# Patient Record
Sex: Female | Born: 1960 | Race: White | Hispanic: No | Marital: Married | Smoking: Never smoker
Health system: Southern US, Community
[De-identification: ages and names within clinical notes are randomized; demographics above are authoritative.]

## PROBLEM LIST (undated history)

## (undated) DIAGNOSIS — N951 Menopausal and female climacteric states: Secondary | ICD-10-CM

## (undated) DIAGNOSIS — K589 Irritable bowel syndrome without diarrhea: Secondary | ICD-10-CM

## (undated) DIAGNOSIS — I719 Aortic aneurysm of unspecified site, without rupture: Secondary | ICD-10-CM

## (undated) DIAGNOSIS — H9313 Tinnitus, bilateral: Secondary | ICD-10-CM

## (undated) DIAGNOSIS — Z5189 Encounter for other specified aftercare: Secondary | ICD-10-CM

## (undated) DIAGNOSIS — D126 Benign neoplasm of colon, unspecified: Secondary | ICD-10-CM

## (undated) HISTORY — PX: BREAST SURGERY: SHX581

## (undated) HISTORY — DX: Benign neoplasm of colon, unspecified: D12.6

## (undated) HISTORY — DX: Tinnitus, bilateral: H93.13

## (undated) HISTORY — DX: Menopausal and female climacteric states: N95.1

## (undated) HISTORY — DX: Irritable bowel syndrome, unspecified: K58.9

## (undated) HISTORY — DX: Aortic aneurysm of unspecified site, without rupture: I71.9

## (undated) HISTORY — PX: KNEE ARTHROPLASTY: SHX992

## (undated) HISTORY — PX: BACK SURGERY: SHX140

## (undated) HISTORY — PX: OTHER SURGICAL HISTORY: SHX169

## (undated) HISTORY — PX: NOSE SURGERY: SHX723

## (undated) HISTORY — DX: Encounter for other specified aftercare: Z51.89

---

## 1998-03-01 ENCOUNTER — Other Ambulatory Visit: Admission: RE | Admit: 1998-03-01 | Discharge: 1998-03-01 | Payer: Self-pay | Admitting: Gynecology

## 1998-04-12 ENCOUNTER — Other Ambulatory Visit: Admission: RE | Admit: 1998-04-12 | Discharge: 1998-04-12 | Payer: Self-pay | Admitting: Gynecology

## 1999-03-14 ENCOUNTER — Other Ambulatory Visit: Admission: RE | Admit: 1999-03-14 | Discharge: 1999-03-14 | Payer: Self-pay | Admitting: Gynecology

## 2000-09-05 HISTORY — PX: AUGMENTATION MAMMAPLASTY: SUR837

## 2000-09-17 ENCOUNTER — Encounter: Payer: Self-pay | Admitting: Internal Medicine

## 2000-09-17 ENCOUNTER — Encounter: Admission: RE | Admit: 2000-09-17 | Discharge: 2000-09-17 | Payer: Self-pay | Admitting: Internal Medicine

## 2001-03-18 ENCOUNTER — Other Ambulatory Visit: Admission: RE | Admit: 2001-03-18 | Discharge: 2001-03-18 | Payer: Self-pay | Admitting: Internal Medicine

## 2001-09-23 ENCOUNTER — Encounter: Admission: RE | Admit: 2001-09-23 | Discharge: 2001-09-23 | Payer: Self-pay | Admitting: Sports Medicine

## 2001-10-07 ENCOUNTER — Encounter: Admission: RE | Admit: 2001-10-07 | Discharge: 2001-10-07 | Payer: Self-pay | Admitting: Sports Medicine

## 2002-05-19 ENCOUNTER — Encounter: Admission: RE | Admit: 2002-05-19 | Discharge: 2002-05-19 | Payer: Self-pay | Admitting: Internal Medicine

## 2002-05-19 ENCOUNTER — Encounter: Payer: Self-pay | Admitting: Internal Medicine

## 2002-05-27 ENCOUNTER — Emergency Department (HOSPITAL_COMMUNITY): Admission: EM | Admit: 2002-05-27 | Discharge: 2002-05-27 | Payer: Self-pay | Admitting: *Deleted

## 2002-06-16 ENCOUNTER — Encounter: Admission: RE | Admit: 2002-06-16 | Discharge: 2002-06-16 | Payer: Self-pay | Admitting: Sports Medicine

## 2002-06-30 ENCOUNTER — Other Ambulatory Visit: Admission: RE | Admit: 2002-06-30 | Discharge: 2002-06-30 | Payer: Self-pay | Admitting: Internal Medicine

## 2003-03-01 ENCOUNTER — Emergency Department (HOSPITAL_COMMUNITY): Admission: EM | Admit: 2003-03-01 | Discharge: 2003-03-01 | Payer: Self-pay | Admitting: Emergency Medicine

## 2003-06-08 ENCOUNTER — Other Ambulatory Visit: Admission: RE | Admit: 2003-06-08 | Discharge: 2003-06-08 | Payer: Self-pay | Admitting: Gynecology

## 2004-01-11 ENCOUNTER — Encounter: Admission: RE | Admit: 2004-01-11 | Discharge: 2004-01-11 | Payer: Self-pay | Admitting: Sports Medicine

## 2004-02-01 ENCOUNTER — Encounter: Admission: RE | Admit: 2004-02-01 | Discharge: 2004-02-01 | Payer: Self-pay | Admitting: Sports Medicine

## 2004-03-14 ENCOUNTER — Encounter: Admission: RE | Admit: 2004-03-14 | Discharge: 2004-03-14 | Payer: Self-pay | Admitting: Gynecology

## 2004-03-28 ENCOUNTER — Encounter: Admission: RE | Admit: 2004-03-28 | Discharge: 2004-03-28 | Payer: Self-pay | Admitting: Gynecology

## 2004-06-20 ENCOUNTER — Other Ambulatory Visit: Admission: RE | Admit: 2004-06-20 | Discharge: 2004-06-20 | Payer: Self-pay | Admitting: Gynecology

## 2005-08-14 ENCOUNTER — Other Ambulatory Visit: Admission: RE | Admit: 2005-08-14 | Discharge: 2005-08-14 | Payer: Self-pay | Admitting: Gynecology

## 2005-10-02 ENCOUNTER — Encounter: Admission: RE | Admit: 2005-10-02 | Discharge: 2005-10-02 | Payer: Self-pay | Admitting: Gynecology

## 2005-11-27 ENCOUNTER — Ambulatory Visit (HOSPITAL_COMMUNITY): Admission: RE | Admit: 2005-11-27 | Discharge: 2005-11-27 | Payer: Self-pay | Admitting: Gynecology

## 2006-08-06 ENCOUNTER — Ambulatory Visit: Admission: RE | Admit: 2006-08-06 | Discharge: 2006-08-06 | Payer: Self-pay | Admitting: Gynecologic Oncology

## 2006-11-19 ENCOUNTER — Encounter: Admission: RE | Admit: 2006-11-19 | Discharge: 2006-11-19 | Payer: Self-pay | Admitting: Gynecology

## 2006-11-26 ENCOUNTER — Other Ambulatory Visit: Admission: RE | Admit: 2006-11-26 | Discharge: 2006-11-26 | Payer: Self-pay | Admitting: Gynecology

## 2007-01-09 ENCOUNTER — Encounter: Admission: RE | Admit: 2007-01-09 | Discharge: 2007-01-09 | Payer: Self-pay | Admitting: Orthopedic Surgery

## 2007-12-09 ENCOUNTER — Other Ambulatory Visit: Admission: RE | Admit: 2007-12-09 | Discharge: 2007-12-09 | Payer: Self-pay | Admitting: Gynecology

## 2007-12-09 ENCOUNTER — Encounter: Admission: RE | Admit: 2007-12-09 | Discharge: 2007-12-09 | Payer: Self-pay | Admitting: Gynecology

## 2009-03-22 ENCOUNTER — Encounter: Admission: RE | Admit: 2009-03-22 | Discharge: 2009-03-22 | Payer: Self-pay | Admitting: Obstetrics and Gynecology

## 2010-03-28 ENCOUNTER — Encounter: Admission: RE | Admit: 2010-03-28 | Discharge: 2010-03-28 | Payer: Self-pay | Admitting: Obstetrics and Gynecology

## 2010-11-26 ENCOUNTER — Encounter: Payer: Self-pay | Admitting: Gynecology

## 2010-12-19 ENCOUNTER — Encounter: Payer: Self-pay | Admitting: Sports Medicine

## 2010-12-19 ENCOUNTER — Encounter (INDEPENDENT_AMBULATORY_CARE_PROVIDER_SITE_OTHER): Payer: BC Managed Care – PPO | Admitting: Sports Medicine

## 2010-12-19 DIAGNOSIS — M25519 Pain in unspecified shoulder: Secondary | ICD-10-CM

## 2010-12-19 DIAGNOSIS — R269 Unspecified abnormalities of gait and mobility: Secondary | ICD-10-CM

## 2010-12-19 DIAGNOSIS — M2170 Unequal limb length (acquired), unspecified site: Secondary | ICD-10-CM | POA: Insufficient documentation
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
## 2011-02-20 ENCOUNTER — Other Ambulatory Visit: Payer: Self-pay | Admitting: Obstetrics and Gynecology

## 2011-02-20 DIAGNOSIS — Z1231 Encounter for screening mammogram for malignant neoplasm of breast: Secondary | ICD-10-CM

## 2011-03-06 ENCOUNTER — Ambulatory Visit (INDEPENDENT_AMBULATORY_CARE_PROVIDER_SITE_OTHER): Payer: BC Managed Care – PPO | Admitting: Sports Medicine

## 2011-03-06 ENCOUNTER — Encounter: Payer: Self-pay | Admitting: Sports Medicine

## 2011-03-06 VITALS — BP 114/72 | HR 46

## 2011-03-06 DIAGNOSIS — M2170 Unequal limb length (acquired), unspecified site: Secondary | ICD-10-CM

## 2011-03-06 DIAGNOSIS — R269 Unspecified abnormalities of gait and mobility: Secondary | ICD-10-CM

## 2011-03-06 NOTE — Assessment & Plan Note (Signed)
Patient was fitted for a : standard, cushioned, semi-rigid orthotic. The orthotic was heated and afterward the patient stood on the orthotic blank positioned on the orthotic stand. The patient was positioned in subtalar neutral position and 10 degrees of ankle dorsiflexion in a weight bearing stance. After completion of molding, a stable base was applied to the orthotic blank. The blank was ground to a stable position for weight bearing. Size: 9 black stripe Base: Green F3 Posting: Blue foam on rt Additional orthotic padding: None  Pt's walking and running gait was in a neutral position with orthotics.  She states they are comfortable, but feels slightly more cushion in the front of her arch.  She does not think this will cause a problem, wants to try first before adjustment.  Pt was advised to return to clinic for adjustment if needed.

## 2011-03-06 NOTE — Assessment & Plan Note (Addendum)
New orthotics made today to correct.  Gait neutral with orthotics.

## 2011-03-06 NOTE — Progress Notes (Signed)
  Subjective:    Patient ID: Kathy Weaver, female    DOB: 1961-10-24, 50 y.o.   MRN: [id number]  HPI  Pt here today for orthotics redo.  States the orthotics that were made for her in Feb have been causing blisters in her arches.  The previous orthotics had been adjusted on 3 different occassions, thus Dr. Darrick Penna said new orthotics should be made for patient at no charge.  Pt is running 2-3 times per week.  Review of Systems     Objective:   Physical Exam See previous visit       Assessment & Plan:
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
## 2011-04-10 ENCOUNTER — Ambulatory Visit
Admission: RE | Admit: 2011-04-10 | Discharge: 2011-04-10 | Disposition: A | Discharge status: Home / Self Care | Payer: BC Managed Care – PPO | Source: Ambulatory Visit | Attending: Obstetrics and Gynecology | Admitting: Obstetrics and Gynecology

## 2011-04-10 DIAGNOSIS — Z1231 Encounter for screening mammogram for malignant neoplasm of breast: Secondary | ICD-10-CM

## 2012-06-02 ENCOUNTER — Other Ambulatory Visit: Payer: Self-pay | Admitting: Obstetrics and Gynecology

## 2012-06-02 DIAGNOSIS — Z1231 Encounter for screening mammogram for malignant neoplasm of breast: Secondary | ICD-10-CM

## 2012-07-08 ENCOUNTER — Ambulatory Visit
Admission: RE | Admit: 2012-07-08 | Discharge: 2012-07-08 | Disposition: A | Discharge status: Home / Self Care | Payer: BC Managed Care – PPO | Source: Ambulatory Visit | Attending: Obstetrics and Gynecology | Admitting: Obstetrics and Gynecology

## 2012-07-08 DIAGNOSIS — Z1231 Encounter for screening mammogram for malignant neoplasm of breast: Secondary | ICD-10-CM

## 2012-12-10 ENCOUNTER — Other Ambulatory Visit: Payer: Self-pay | Admitting: Orthopaedic Surgery

## 2012-12-23 ENCOUNTER — Encounter (HOSPITAL_BASED_OUTPATIENT_CLINIC_OR_DEPARTMENT_OTHER): Payer: Self-pay | Admitting: *Deleted
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
## 2012-12-25 ENCOUNTER — Ambulatory Visit (INDEPENDENT_AMBULATORY_CARE_PROVIDER_SITE_OTHER): Payer: BC Managed Care – PPO | Admitting: General Surgery

## 2012-12-25 ENCOUNTER — Encounter (INDEPENDENT_AMBULATORY_CARE_PROVIDER_SITE_OTHER): Payer: Self-pay | Admitting: General Surgery

## 2012-12-25 VITALS — BP 100/62 | HR 64 | Resp 18 | Ht 68.5 in | Wt 141.0 lb

## 2012-12-25 DIAGNOSIS — D171 Benign lipomatous neoplasm of skin and subcutaneous tissue of trunk: Secondary | ICD-10-CM

## 2012-12-25 DIAGNOSIS — D1739 Benign lipomatous neoplasm of skin and subcutaneous tissue of other sites: Secondary | ICD-10-CM

## 2012-12-25 NOTE — Progress Notes (Signed)
Patient ID: Kathy Weaver, female   DOB: 1961/10/24, 52 y.o.   MRN: [id number]  Chief Complaint  Patient presents with  . Other    Eval mass on abdomen    HPI Kathy Weaver is a 52 y.o. female.   HPI  She noticed a subcutaneous soft tissue mass in the right lower chest wall/right upper quadrant area about 2 years ago. The mass is mobile. It has not changed in size. It is not painful. It has not been infected or blood. She is unaware of any trauma to the area. She was seen by her primary care physician Dr. Valentina Lucks and he recommended she have a surgeon evaluate it.  She is going to have shoulder surgery in 5 days. She is a International aid/development worker.  Past Medical History  Diagnosis Date  . Blood transfusion without reported diagnosis     Past Surgical History  Procedure Laterality Date  . Left femur fracture      1990  . Breast surgery      right breast implant  . Back surgery      spinal fusion  for L4( rods)    History reviewed. No pertinent family history.  Social History History  Substance Use Topics  . Smoking status: Never Smoker   . Smokeless tobacco: Not on file  . Alcohol Use: Yes     Comment: wine 4 days a week    Allergies  Allergen Reactions  . Penicillins Hives  . Sulfur Swelling    Current Outpatient Prescriptions  Medication Sig Dispense Refill  . cholecalciferol (VITAMIN D) 1000 UNITS tablet Take 1,000 Units by mouth daily.      Marland Kitchen etonogestrel-ethinyl estradiol (NUVARING) 0.12-0.015 MG/24HR vaginal ring Place 1 each vaginally every 28 (twenty-eight) days. Insert vaginally and leave in place for 3 consecutive weeks, then remove for 1 week.      Marland Kitchen ibuprofen (ADVIL,MOTRIN) 200 MG tablet Take 200 mg by mouth every 6 (six) hours as needed for pain.       No current facility-administered medications for this visit.    Review of Systems Review of Systems  Constitutional: Negative.   Respiratory: Negative.   Cardiovascular: Negative.   Musculoskeletal: Positive for  arthralgias (Right shoulder).    Blood pressure 100/62, pulse 64, resp. rate 18, height 5' 8.5" (1.74 m), weight 141 lb (63.957 kg), last menstrual period 12/15/2012.  Physical Exam Physical Exam  Constitutional: She appears well-developed and well-nourished. No distress.  Abdominal:  In the right upper quadrant region/right chest wall area is a 2.5 cm mobile nontender nonerythematous soft tissue mass. No skin changes over it.    Data Reviewed none  Assessment    Right lower chest wall/right upper quadrant soft tissue mass in the subcutaneous tissues. Clinically, this is consistent with a lipoma. It is not symptomatic. It has not changed size in 2 years by her report.     Plan    No current indication for removal. If he becomes symptomatic or larger however I have asked her to inform us as I think at that time removal of it would be indicated.        Talayeh Bruinsma J 12/25/2012, 9:13 AM

## 2012-12-25 NOTE — Patient Instructions (Signed)
Call if the area gets larger or becomes painful.

## 2012-12-30 ENCOUNTER — Ambulatory Visit (HOSPITAL_BASED_OUTPATIENT_CLINIC_OR_DEPARTMENT_OTHER): Payer: BC Managed Care – PPO | Admitting: Anesthesiology

## 2012-12-30 ENCOUNTER — Encounter (HOSPITAL_BASED_OUTPATIENT_CLINIC_OR_DEPARTMENT_OTHER)
Admission: RE | Disposition: A | Discharge status: Home / Self Care | Payer: Self-pay | Source: Ambulatory Visit | Attending: Orthopaedic Surgery

## 2012-12-30 ENCOUNTER — Encounter (HOSPITAL_BASED_OUTPATIENT_CLINIC_OR_DEPARTMENT_OTHER): Payer: Self-pay | Admitting: Anesthesiology

## 2012-12-30 ENCOUNTER — Encounter (HOSPITAL_BASED_OUTPATIENT_CLINIC_OR_DEPARTMENT_OTHER): Payer: Self-pay

## 2012-12-30 ENCOUNTER — Ambulatory Visit (HOSPITAL_BASED_OUTPATIENT_CLINIC_OR_DEPARTMENT_OTHER)
Admission: RE | Admit: 2012-12-30 | Discharge: 2012-12-30 | Disposition: A | Discharge status: Home / Self Care | Payer: BC Managed Care – PPO | Source: Ambulatory Visit | Attending: Orthopaedic Surgery | Admitting: Orthopaedic Surgery

## 2012-12-30 DIAGNOSIS — M7530 Calcific tendinitis of unspecified shoulder: Secondary | ICD-10-CM | POA: Insufficient documentation

## 2012-12-30 DIAGNOSIS — M25819 Other specified joint disorders, unspecified shoulder: Secondary | ICD-10-CM | POA: Insufficient documentation

## 2012-12-30 DIAGNOSIS — M7541 Impingement syndrome of right shoulder: Secondary | ICD-10-CM | POA: Diagnosis present

## 2012-12-30 HISTORY — PX: SHOULDER ARTHROSCOPY: SHX128

## 2012-12-30 SURGERY — ARTHROSCOPY, SHOULDER
Anesthesia: General | Site: Shoulder | Laterality: Right | Wound class: Clean

## 2012-12-30 MED ORDER — LIDOCAINE HCL (CARDIAC) 20 MG/ML IV SOLN
INTRAVENOUS | Status: DC | PRN
  Administered 2012-12-30: 80 mg via INTRAVENOUS

## 2012-12-30 MED ORDER — PROPOFOL 10 MG/ML IV BOLUS
INTRAVENOUS | Status: DC | PRN
  Administered 2012-12-30: 170 mg via INTRAVENOUS

## 2012-12-30 MED ORDER — MIDAZOLAM HCL 2 MG/2ML IJ SOLN
1.0000 mg | INTRAMUSCULAR | Status: DC | PRN
  Administered 2012-12-30: 2 mg via INTRAVENOUS

## 2012-12-30 MED ORDER — HYDROCODONE-ACETAMINOPHEN 5-325 MG PO TABS: 1.0000 | ORAL_TABLET | Freq: Four times a day (QID) | ORAL | Status: DC | PRN

## 2012-12-30 MED ORDER — ONDANSETRON HCL 4 MG/2ML IJ SOLN
INTRAMUSCULAR | Status: DC | PRN
  Administered 2012-12-30: 4 mg via INTRAVENOUS

## 2012-12-30 MED ORDER — OXYCODONE HCL 5 MG PO TABS: 5.0000 mg | ORAL_TABLET | Freq: Once | ORAL | Status: DC | PRN

## 2012-12-30 MED ORDER — LACTATED RINGERS IV SOLN
INTRAVENOUS | Status: DC
  Administered 2012-12-30 (×2): via INTRAVENOUS

## 2012-12-30 MED ORDER — SODIUM CHLORIDE 0.9 % IR SOLN
Status: DC | PRN
  Administered 2012-12-30: 5000 mL

## 2012-12-30 MED ORDER — LACTATED RINGERS IV SOLN
INTRAVENOUS | Status: DC
  Administered 2012-12-30: 10:00:00 via INTRAVENOUS

## 2012-12-30 MED ORDER — HYDROMORPHONE HCL PF 1 MG/ML IJ SOLN: 0.2500 mg | INTRAMUSCULAR | Status: DC | PRN

## 2012-12-30 MED ORDER — EPHEDRINE SULFATE 50 MG/ML IJ SOLN
INTRAMUSCULAR | Status: DC | PRN
  Administered 2012-12-30: 10 mg via INTRAVENOUS

## 2012-12-30 MED ORDER — BUPIVACAINE-EPINEPHRINE PF 0.5-1:200000 % IJ SOLN
INTRAMUSCULAR | Status: DC | PRN
  Administered 2012-12-30: 25 mL

## 2012-12-30 MED ORDER — SUCCINYLCHOLINE CHLORIDE 20 MG/ML IJ SOLN
INTRAMUSCULAR | Status: DC | PRN
  Administered 2012-12-30: 100 mg via INTRAVENOUS

## 2012-12-30 MED ORDER — FENTANYL CITRATE 0.05 MG/ML IJ SOLN
50.0000 ug | INTRAMUSCULAR | Status: DC | PRN
  Administered 2012-12-30: 100 ug via INTRAVENOUS

## 2012-12-30 MED ORDER — CEFAZOLIN SODIUM-DEXTROSE 2-3 GM-% IV SOLR
2.0000 g | INTRAVENOUS | Status: AC
  Administered 2012-12-30: 2 g via INTRAVENOUS

## 2012-12-30 MED ORDER — DEXAMETHASONE SODIUM PHOSPHATE 4 MG/ML IJ SOLN
INTRAMUSCULAR | Status: DC | PRN
  Administered 2012-12-30: 4 mg

## 2012-12-30 MED ORDER — OXYCODONE HCL 5 MG/5ML PO SOLN: 5.0000 mg | Freq: Once | ORAL | Status: DC | PRN

## 2012-12-30 MED ORDER — PROMETHAZINE HCL 25 MG/ML IJ SOLN: 6.2500 mg | INTRAMUSCULAR | Status: DC | PRN

## 2012-12-30 MED ORDER — CHLORHEXIDINE GLUCONATE 4 % EX LIQD: 60.0000 mL | Freq: Once | CUTANEOUS | Status: DC

## 2012-12-30 SURGICAL SUPPLY — 73 items
ADH SKN CLS APL DERMABOND .7 (GAUZE/BANDAGES/DRESSINGS)
APL SKNCLS STERI-STRIP NONHPOA (GAUZE/BANDAGES/DRESSINGS)
BENZOIN TINCTURE PRP APPL 2/3 (GAUZE/BANDAGES/DRESSINGS) IMPLANT
BLADE CUDA 5.5 (BLADE) IMPLANT
BLADE GREAT WHITE 4.2 (BLADE) ×2 IMPLANT
BLADE SURG 15 STRL LF DISP TIS (BLADE) IMPLANT
BLADE SURG 15 STRL SS (BLADE)
BUR VERTEX HOODED 4.5 (BURR) IMPLANT
CANISTER OMNI JUG 16 LITER (MISCELLANEOUS) ×2 IMPLANT
CANISTER SUCTION 2500CC (MISCELLANEOUS) IMPLANT
CANNULA SHOULDER 7CM (CANNULA) ×2 IMPLANT
CANNULA TWIST IN 8.25X7CM (CANNULA) IMPLANT
DECANTER SPIKE VIAL GLASS SM (MISCELLANEOUS) IMPLANT
DERMABOND ADVANCED (GAUZE/BANDAGES/DRESSINGS)
DERMABOND ADVANCED .7 DNX12 (GAUZE/BANDAGES/DRESSINGS) IMPLANT
DRAPE OEC MINIVIEW 54X84 (DRAPES) ×1 IMPLANT
DRAPE STERI 35X30 U-POUCH (DRAPES) ×2 IMPLANT
DRAPE U-SHAPE 47X51 STRL (DRAPES) ×2 IMPLANT
DRAPE U-SHAPE 76X120 STRL (DRAPES) ×4 IMPLANT
DRSG EMULSION OIL 3X3 NADH (GAUZE/BANDAGES/DRESSINGS) ×2 IMPLANT
DRSG PAD ABDOMINAL 8X10 ST (GAUZE/BANDAGES/DRESSINGS) ×2 IMPLANT
DURAPREP 26ML APPLICATOR (WOUND CARE) ×2 IMPLANT
ELECT MENISCUS 165MM 90D (ELECTRODE) IMPLANT
ELECT REM PT RETURN 9FT ADLT (ELECTROSURGICAL) ×2
ELECTRODE REM PT RTRN 9FT ADLT (ELECTROSURGICAL) ×1 IMPLANT
GLOVE BIO SURGEON STRL SZ 6.5 (GLOVE) ×1 IMPLANT
GLOVE BIO SURGEON STRL SZ8.5 (GLOVE) ×2 IMPLANT
GLOVE BIOGEL PI IND STRL 7.0 (GLOVE) IMPLANT
GLOVE BIOGEL PI IND STRL 8 (GLOVE) ×1 IMPLANT
GLOVE BIOGEL PI IND STRL 8.5 (GLOVE) ×1 IMPLANT
GLOVE BIOGEL PI INDICATOR 7.0 (GLOVE) ×1
GLOVE BIOGEL PI INDICATOR 8 (GLOVE) ×1
GLOVE BIOGEL PI INDICATOR 8.5 (GLOVE) ×1
GLOVE SS BIOGEL STRL SZ 8 (GLOVE) ×1 IMPLANT
GLOVE SUPERSENSE BIOGEL SZ 8 (GLOVE) ×1
GOWN PREVENTION PLUS XLARGE (GOWN DISPOSABLE) ×2 IMPLANT
GOWN PREVENTION PLUS XXLARGE (GOWN DISPOSABLE) ×3 IMPLANT
NDL SCORPION MULTI FIRE (NEEDLE) IMPLANT
NDL SUT 6 .5 CRC .975X.05 MAYO (NEEDLE) IMPLANT
NEEDLE MAYO TAPER (NEEDLE)
NEEDLE SCORPION MULTI FIRE (NEEDLE) IMPLANT
NS IRRIG 1000ML POUR BTL (IV SOLUTION) IMPLANT
PACK ARTHROSCOPY DSU (CUSTOM PROCEDURE TRAY) ×2 IMPLANT
PACK BASIN DAY SURGERY FS (CUSTOM PROCEDURE TRAY) ×2 IMPLANT
PASSER SUT SWANSON 36MM LOOP (INSTRUMENTS) IMPLANT
PENCIL BUTTON HOLSTER BLD 10FT (ELECTRODE) IMPLANT
SET ARTHROSCOPY TUBING (MISCELLANEOUS) ×2
SET ARTHROSCOPY TUBING LN (MISCELLANEOUS) ×1 IMPLANT
SHEET MEDIUM DRAPE 40X70 STRL (DRAPES) ×2 IMPLANT
SLEEVE SCD COMPRESS KNEE MED (MISCELLANEOUS) ×1 IMPLANT
SLING ARM FOAM STRAP LRG (SOFTGOODS) ×1 IMPLANT
SLING ARM FOAM STRAP MED (SOFTGOODS) IMPLANT
SLING ARM FOAM STRAP SML (SOFTGOODS) IMPLANT
SLING ARM FOAM STRAP XLG (SOFTGOODS) IMPLANT
SPONGE GAUZE 4X4 12PLY (GAUZE/BANDAGES/DRESSINGS) ×2 IMPLANT
SPONGE LAP 4X18 X RAY DECT (DISPOSABLE) IMPLANT
STRIP CLOSURE SKIN 1/2X4 (GAUZE/BANDAGES/DRESSINGS) IMPLANT
SUCTION FRAZIER TIP 10 FR DISP (SUCTIONS) IMPLANT
SUT ETHIBOND 2 OS 4 DA (SUTURE) IMPLANT
SUT ETHILON 3 0 PS 1 (SUTURE) ×2 IMPLANT
SUT FIBERWIRE #2 38 T-5 BLUE (SUTURE)
SUT PDS AB 2-0 CT2 27 (SUTURE) IMPLANT
SUT VIC AB 0 SH 27 (SUTURE) IMPLANT
SUT VIC AB 2-0 SH 27 (SUTURE)
SUT VIC AB 2-0 SH 27XBRD (SUTURE) IMPLANT
SUT VICRYL 4-0 PS2 18IN ABS (SUTURE) IMPLANT
SUTURE FIBERWR #2 38 T-5 BLUE (SUTURE) IMPLANT
SYR BULB 3OZ (MISCELLANEOUS) IMPLANT
TOWEL OR 17X24 6PK STRL BLUE (TOWEL DISPOSABLE) ×2 IMPLANT
TOWEL OR NON WOVEN STRL DISP B (DISPOSABLE) ×2 IMPLANT
WAND STAR VAC 90 (SURGICAL WAND) ×2 IMPLANT
WATER STERILE IRR 1000ML POUR (IV SOLUTION) ×2 IMPLANT
YANKAUER SUCT BULB TIP NO VENT (SUCTIONS) IMPLANT

## 2012-12-30 NOTE — Interval H&P Note (Signed)
History and Physical Interval Note:  12/30/2012 9:51 AM  Kathy Weaver  has presented today for surgery, with the diagnosis of Right Shoulder Impingement  The various methods of treatment have been discussed with the patient and family. After consideration of risks, benefits and other options for treatment, the patient has consented to  Procedure(s): ARTHROSCOPY SHOULDER (Right) as a surgical intervention .  The patient's history has been reviewed, patient examined, no change in status, stable for surgery.  I have reviewed the patient's chart and labs.  Questions were answered to the patient's satisfaction.     Anushree Dorsi G

## 2012-12-30 NOTE — Transfer of Care (Signed)
Immediate Anesthesia Transfer of Care Note  Patient: Kathy Weaver  Procedure(s) Performed: Procedure(s): Arthroscopy Right Shoulder with Subacromial Decompression and Debridement (Right)  Patient Location: PACU  Anesthesia Type:General and Regional  Level of Consciousness: awake, alert  and oriented  Airway & Oxygen Therapy: Patient Spontanous Breathing and Patient connected to face mask oxygen  Post-op Assessment: Report given to PACU RN and Post -op Vital signs reviewed and stable  Post vital signs: Reviewed and stable  Complications: No apparent anesthesia complications

## 2012-12-30 NOTE — Brief Op Note (Signed)
12/30/2012  11:32 AM  PATIENT:  Kathy Weaver  52 y.o. female  PRE-OPERATIVE DIAGNOSIS:  Right Shoulder Impingement  POST-OPERATIVE DIAGNOSIS:  Right Shoulder Impingement  PROCEDURE:  Procedure(s): Arthroscopy Right Shoulder with Subacromial Decompression and Debridement (Right)  SURGEON:  Surgeon(s) and Role:    * Velna Ochs, MD - Primary  PHYSICIAN ASSISTANT:   ASSISTANTS: none   ANESTHESIA:     EBL:  Total I/O In: 1500 [I.V.:1500] Out: -   BLOOD ADMINISTERED:  DRAINS:   LOCAL MEDICATIONS USED:  SPECIMEN:    DISPOSITION OF SPECIMEN:    COUNTS:    TOURNIQUET:    DICTATION: .  PLAN OF CARE:   PATIENT DISPOSITION:   Delay start of Pharmacological VTE agent (>24hrs) due to surgical blood loss or risk of bleeding: not applicable

## 2012-12-30 NOTE — Progress Notes (Signed)
Assisted Dr. Kasik with right, ultrasound guided, interscalene  block. Side rails up, monitors on throughout procedure. See vital signs in flow sheet. Tolerated Procedure well. 

## 2012-12-30 NOTE — Anesthesia Procedure Notes (Addendum)
Anesthesia Regional Block:  Interscalene brachial plexus block  Pre-Anesthetic Checklist: ,, timeout performed, Correct Patient, Correct Site, Correct Laterality, Correct Procedure, Correct Position, site marked, Risks and benefits discussed,  Surgical consent,  Pre-op evaluation,  At surgeon's request and post-op pain management  Laterality: Right  Prep: chloraprep       Needles:  Injection technique: Single-shot  Needle Type: Echogenic Stimulator Needle     Needle Length: 5cm 5 cm Needle Gauge: 22 and 22 G    Additional Needles:  Procedures: ultrasound guided (picture in chart) and nerve stimulator Interscalene brachial plexus block  Nerve Stimulator or Paresthesia:  Response: 0.5 mA,   Additional Responses:   Narrative:  Start time: 12/30/2012 9:48 AM End time: 12/30/2012 10:00 AM Injection made incrementally with aspirations every 3 mL. Anesthesiologist: Dr Gypsy Balsam  Additional Notes: [id number] R ISB POP CHG prep, sterile tech #22 stim/echo needle with stim down to .5ma and good [place] visualization -pix in chart Multiple neg asp/ no paresthesias Vernia Buff .5% w/epi 1:200000 total 25cc+decadron 4mg  infiltrated No compl Dr Gypsy Balsam   Procedure Name: Intubation Date/Time: 12/30/2012 10:17 AM Performed by: Meyer Russel Pre-anesthesia Checklist: Patient identified, Emergency Drugs available, Suction available and Patient being monitored Patient Re-evaluated:Patient Re-evaluated prior to inductionOxygen Delivery Method: Circle System Utilized Preoxygenation: Pre-oxygenation with 100% oxygen Intubation Type: IV induction Ventilation: Mask ventilation without difficulty Laryngoscope Size: Miller and 2 Grade View: Grade I Tube type: Oral Tube size: 7.0 mm Number of attempts: 1 Airway Equipment and Method: stylet and LTA kit utilized Placement Confirmation: ETT inserted through vocal cords under direct vision,  positive ETCO2 and breath sounds checked- equal and  bilateral Secured at: 22 cm Tube secured with: Tape Dental Injury: Teeth and Oropharynx as per pre-operative assessment

## 2012-12-30 NOTE — Anesthesia Postprocedure Evaluation (Signed)
  Anesthesia Post-op Note  Patient: Kathy Weaver  Procedure(s) Performed: Procedure(s): Arthroscopy Right Shoulder with Subacromial Decompression and Debridement (Right)  Patient Location: PACU  Anesthesia Type:GA combined with regional for post-op pain  Level of Consciousness: awake and alert   Airway and Oxygen Therapy: Patient Spontanous Breathing  Post-op Pain: none  Post-op Assessment: Post-op Vital signs reviewed, Patient's Cardiovascular Status Stable, Respiratory Function Stable, Patent Airway, No signs of Nausea or vomiting, Adequate PO intake and Pain level controlled  Post-op Vital Signs: stable  Complications: No apparent anesthesia complications

## 2012-12-30 NOTE — Anesthesia Preprocedure Evaluation (Addendum)
Anesthesia Evaluation  Patient identified by MRN, date of birth, ID band Patient awake    Reviewed: Allergy & Precautions, H&P , NPO status , Patient's Chart, lab work & pertinent test results  History of Anesthesia Complications (+) PONV  Airway Mallampati: I TM Distance: >3 FB Neck ROM: Full    Dental   Pulmonary  breath sounds clear to auscultation        Cardiovascular Rhythm:Regular Rate:Normal     Neuro/Psych    GI/Hepatic   Endo/Other    Renal/GU      Musculoskeletal   Abdominal   Peds  Hematology   Anesthesia Other Findings   Reproductive/Obstetrics                          Anesthesia Physical Anesthesia Plan  ASA: I  Anesthesia Plan: General   Post-op Pain Management:    Induction: Intravenous  Airway Management Planned: Oral ETT  Additional Equipment:   Intra-op Plan:   Post-operative Plan: Extubation in OR  Informed Consent: I have reviewed the patients History and Physical, chart, labs and discussed the procedure including the risks, benefits and alternatives for the proposed anesthesia with the patient or authorized representative who has indicated his/her understanding and acceptance.     Plan Discussed with: CRNA and Surgeon  Anesthesia Plan Comments:         Anesthesia Quick Evaluation

## 2012-12-30 NOTE — Op Note (Signed)
#  [id number] 

## 2012-12-31 ENCOUNTER — Encounter (HOSPITAL_BASED_OUTPATIENT_CLINIC_OR_DEPARTMENT_OTHER): Payer: Self-pay | Admitting: Orthopaedic Surgery

## 2012-12-31 LAB — POCT HEMOGLOBIN-HEMACUE: Hemoglobin: 16.1 g/dL — ABNORMAL HIGH (ref 12.0–15.0)

## 2012-12-31 NOTE — Op Note (Signed)
NAME:  Kathy Weaver, Kathy Weaver              ACCOUNT NO.:  [id number]  MEDICAL RECORD NO.:  [id number]  LOCATION:                               FACILITY:  [facility]  PHYSICIAN:  Lubertha Basque. Amparo Donalson, M.D.DATE OF BIRTH:  May 12, 1961  DATE OF PROCEDURE:  12/30/2012 DATE OF DISCHARGE:  12/30/2012                              OPERATIVE REPORT   PREOPERATIVE DIAGNOSIS:  Right shoulder impingement.  POSTOPERATIVE DIAGNOSIS:  Right shoulder impingement.  PROCEDURE: 1. Right shoulder arthroscopic acromioplasty. 2. Right shoulder arthroscopic debridement.  ANESTHESIA:  General and block.  ATTENDING SURGEON:  Lubertha Basque. Jerl Santos, M.D.  No assistants.  INDICATION FOR PROCEDURE:  The patient is a 51 year old woman with a long history of right shoulder pain.  This has persisted despite an exercise program and a subacromial injection, which did afford her transient relief.  By x-ray and MRI, she has a small calcific deposit. She has things consistent with impingement.  She is offered an acromioplasty with excision of the calcium deposit.  Informed operative consent was obtained after discussion of possible complications including reaction to anesthesia and infection.  SUMMARY OF FINDINGS AND PROCEDURE:  Under general anesthesia and a block, a right shoulder arthroscopy was performed through a total of 2 portals.  She did have things consistent with impingement with an irritated area on her rotator cuff.  I performed a decompression with an acromioplasty.  I then used fluoroscopy to find a small calcium deposit, which was in this area of irritation and I removed most of this deposit percutaneously.  There was no tear worthy of repair.  Her glenohumeral joint looked benign and the biceps tendon and labral structures all looked intact.  She was discharged home the same day.  DESCRIPTION OF PROCEDURE:  The patient was taken to the operating suite where general anesthetic was applied without difficulty.  She  was also given a block in the pre-anesthesia area.  She was positioned in a beach- chair position and prepped and draped in normal sterile fashion.  After administration of IV Kefzol and an appropriate time out, an arthroscopy of the right shoulder was performed through a total of 2 portals. Findings were as noted above.  Procedure consisted initially of the acromioplasty done with a bur in the lateral position followed by transfer of the bur to the posterior position.  I then debrided the cuff and found the irritated area.  I placed a needle in the area of the calcium deposit and then brought up fluoroscopy machine to confirm that I was in the proper spot.  I then needled this area arthroscopically and white calcium deposit emanated.  I then debrided this somewhat.  I removed as much of this deposit as I could without violating the rotator cuff.  The shoulder was thoroughly irrigated followed by reapproximation of the portals loosely with nylon.  Adaptic was applied followed by dry gauze and tape.  Estimated blood loss and intraoperative fluids can be obtained from anesthesia records.  DISPOSITION:  The patient was extubated in operating room and taken to the recovery room in stable condition.  She was to go home same-day and follow up in the office closely.  I will  contact her by phone tonight.     Lubertha Basque Jerl Santos, M.D.     PGD/MEDQ  D:  12/30/2012  T:  12/31/2012  Job:  [id number]

## 2013-06-16 ENCOUNTER — Other Ambulatory Visit: Payer: Self-pay

## 2013-06-16 DIAGNOSIS — Z1231 Encounter for screening mammogram for malignant neoplasm of breast: Secondary | ICD-10-CM

## 2013-07-21 ENCOUNTER — Ambulatory Visit
Admission: RE | Admit: 2013-07-21 | Discharge: 2013-07-21 | Disposition: A | Discharge status: Home / Self Care | Payer: BC Managed Care – PPO | Source: Ambulatory Visit

## 2013-07-21 DIAGNOSIS — Z1231 Encounter for screening mammogram for malignant neoplasm of breast: Secondary | ICD-10-CM

## 2014-09-28 ENCOUNTER — Other Ambulatory Visit: Payer: Self-pay

## 2014-09-28 DIAGNOSIS — Z1231 Encounter for screening mammogram for malignant neoplasm of breast: Secondary | ICD-10-CM

## 2014-10-26 ENCOUNTER — Ambulatory Visit
Admission: RE | Admit: 2014-10-26 | Discharge: 2014-10-26 | Disposition: A | Discharge status: Home / Self Care | Payer: Private Health Insurance - Indemnity | Source: Ambulatory Visit

## 2014-10-26 DIAGNOSIS — Z1231 Encounter for screening mammogram for malignant neoplasm of breast: Secondary | ICD-10-CM

## 2015-09-13 ENCOUNTER — Encounter: Payer: Self-pay | Admitting: Sports Medicine

## 2015-09-13 ENCOUNTER — Ambulatory Visit (INDEPENDENT_AMBULATORY_CARE_PROVIDER_SITE_OTHER): Payer: Private Health Insurance - Indemnity | Admitting: Sports Medicine

## 2015-09-13 VITALS — BP 110/70 | Ht 68.0 in | Wt 135.0 lb

## 2015-09-13 DIAGNOSIS — S76309A Unspecified injury of muscle, fascia and tendon of the posterior muscle group at thigh level, unspecified thigh, initial encounter: Secondary | ICD-10-CM | POA: Insufficient documentation

## 2015-09-13 DIAGNOSIS — R269 Unspecified abnormalities of gait and mobility: Secondary | ICD-10-CM | POA: Diagnosis not present

## 2015-09-13 DIAGNOSIS — M79605 Pain in left leg: Secondary | ICD-10-CM | POA: Diagnosis not present

## 2015-09-13 DIAGNOSIS — S76302A Unspecified injury of muscle, fascia and tendon of the posterior muscle group at thigh level, left thigh, initial encounter: Secondary | ICD-10-CM

## 2015-09-13 DIAGNOSIS — S76311S Strain of muscle, fascia and tendon of the posterior muscle group at thigh level, right thigh, sequela: Secondary | ICD-10-CM | POA: Insufficient documentation

## 2015-09-13 NOTE — Assessment & Plan Note (Signed)
Patient has been in orthotics for years to correct gait and to help support low back which has rods  Pre- orthotic gait shows pronation Midfoot strike slt shift to RT  Patient was fitted for a : standard, cushioned, semi-rigid orthotic. The orthotic was heated and afterward the patient stood on the orthotic blank positioned on the orthotic stand. The patient was positioned in subtalar neutral position and 10 degrees of ankle dorsiflexion in a weight bearing stance. After completion of molding, a stable base was applied to the orthotic blank. The blank was ground to a stable position for weight bearing. Size:10 Red EVA Base: Blue EVA Posting: Additional orthotic padding: blue foam on RT  Post orthotic gait We were able to get her to a neutral correction with good control of pronation No trendelenburg  Face to face time of 50 minutes spent to deal with gait abnormality and HS strain.  Over 50% of time spent in cousneling for these problems.

## 2015-09-13 NOTE — Assessment & Plan Note (Signed)
See OV notes

## 2015-09-13 NOTE — Progress Notes (Signed)
Subjective:    Patient ID: Kathy Weaver, female    DOB: [birth year], 54 y.o.   MRN: [id number]  HPI   54 year old female presenting for new appointment in the sports [place] regarding hamstring pain and desire for new orthotics.  #1. Hamstring pain The patient reports a 4 week history of progressively worsening pain at the origin of her RIGHT hamstring along the medial aspect of the initial tuberosity. She states that this is most severe as she is getting ready to enter heel strike on her leg is completely extended. This initially was happening only occasionally but now is happening all the time. Changed her exercise program during this time has been joining an [employer] class in which she is performing lots of sprinting and interval training, particularly on inclined treadmills.   Past: Hx of lumbar fusion/ rods  #2. Orthotics Also presents for creation of custom orthotics. Her last pair from May 2012 have worn out. No new complaints or concerns other than hamstring pain above.   Past medical history, past surgical history, medications, allergies and social history were reviewed and updated in the EMR.  Review of Systems A 10 point review of systems was obtained and negative other than detailed above in history of present illness    Objective:   Physical Exam  General: Resting comfortably on exam table Thin, fit F BP 110/70 mmHg  Ht 5\' 8"  (1.727 m)  Wt 135 lb (61.236 kg)  BMI 20.53 kg/m2  Cardiopulmonary: Non-labored respiration Dermatology: Normal skin about the bilateral LE Psychiatric: Normal mood, affect.  Appropriate thought content Extremities: No edema; neurovascularly intact distally  Bilateral Hip/Thigh/ MSK exam: -- No abnormalities on exam to include edema, ecchymosis or erythema -- No TTP along bilateral inferior pubic rami and ischial tuberosities or SI joints -- 5/5 strength in all cardinal movements of the hip; no pain with any movement  including testing hip extension, abduction and knee flexion -- Negative special testing of hip including noble, faber, ober, log roll  Morton's foot Bilat bunionettes Left lateral colum is subluxed and includes ray 3 Subluxatir Rays 4/5 RT  PROCEDURE NOTE: Patient was fitted for a : standard, cushioned, semi-rigid orthotic. The orthotic was heated and afterward the patient stood on the orthotic blank positioned on the orthotic stand. The patient was positioned in subtalar neutral position and 10 degrees of ankle dorsiflexion in a weight bearing stance. After completion of molding, a stable base was applied to the orthotic blank. The blank was ground to a stable position for weight bearing. Size: 10 red EVA Base: Blue EVA Posting: None Additional orthotic padding: blue lift on RT with foam     Assessment & Plan:   #1. Hamstring strain, origin Patient with a relatively unremarkable exam a classic history for concern of hamstring strain along the common tendon origin at the issue of tuberosity. Patient must likely with a grade 1 strain of that area. Recommended avoiding sprinting/intervals until pain-free. She was given a list of eccentric rehabilitation exercises and will complete those and then gradually return to running. If she has had no improvement with these exercises in 4 weeks, she may return to sports medicine and at that time we would recommend scanning her hamstring from origin to insertion of its 3 respective tendons. The patient agreed with this plan and will make follow-up when necessary based on response to therapy.   She will use compression sleeves for running   #2. Orthotics As p and  then gradually return to running.er the procedure note above, the patient was fitted with custom orthotics with a blue EVA base. Her post orthotic gait was observed and she had a neutral foot strike and stated that her shoes felt very supportive and comfortable with the new orthotic in place.  She may return any time in the next 30 days for an adjustment without charge.  ==

## 2015-09-27 ENCOUNTER — Other Ambulatory Visit: Payer: Self-pay

## 2015-09-27 DIAGNOSIS — Z1231 Encounter for screening mammogram for malignant neoplasm of breast: Secondary | ICD-10-CM

## 2015-11-08 ENCOUNTER — Ambulatory Visit: Payer: Private Health Insurance - Indemnity

## 2015-11-29 ENCOUNTER — Ambulatory Visit
Admission: RE | Admit: 2015-11-29 | Discharge: 2015-11-29 | Disposition: A | Discharge status: Home / Self Care | Payer: 59 | Source: Ambulatory Visit

## 2015-11-29 DIAGNOSIS — Z1231 Encounter for screening mammogram for malignant neoplasm of breast: Secondary | ICD-10-CM

## 2016-11-13 DIAGNOSIS — Z13820 Encounter for screening for osteoporosis: Secondary | ICD-10-CM | POA: Diagnosis not present

## 2016-12-11 DIAGNOSIS — Z01419 Encounter for gynecological examination (general) (routine) without abnormal findings: Secondary | ICD-10-CM | POA: Diagnosis not present

## 2016-12-18 DIAGNOSIS — Z0000 Encounter for general adult medical examination without abnormal findings: Secondary | ICD-10-CM | POA: Diagnosis not present

## 2016-12-26 ENCOUNTER — Other Ambulatory Visit: Payer: Self-pay | Admitting: Obstetrics and Gynecology

## 2016-12-26 DIAGNOSIS — Z1231 Encounter for screening mammogram for malignant neoplasm of breast: Secondary | ICD-10-CM

## 2017-01-15 ENCOUNTER — Ambulatory Visit
Admission: RE | Admit: 2017-01-15 | Discharge: 2017-01-15 | Disposition: A | Discharge status: Home / Self Care | Payer: BLUE CROSS/BLUE SHIELD | Source: Ambulatory Visit | Attending: Obstetrics and Gynecology | Admitting: Obstetrics and Gynecology

## 2017-01-15 DIAGNOSIS — Z1231 Encounter for screening mammogram for malignant neoplasm of breast: Secondary | ICD-10-CM

## 2017-03-12 ENCOUNTER — Ambulatory Visit (INDEPENDENT_AMBULATORY_CARE_PROVIDER_SITE_OTHER): Payer: BLUE CROSS/BLUE SHIELD

## 2017-03-12 DIAGNOSIS — R002 Palpitations: Secondary | ICD-10-CM | POA: Insufficient documentation

## 2017-06-18 ENCOUNTER — Ambulatory Visit (INDEPENDENT_AMBULATORY_CARE_PROVIDER_SITE_OTHER): Payer: BLUE CROSS/BLUE SHIELD | Admitting: Sports Medicine

## 2017-06-18 VITALS — BP 110/70 | Ht 68.0 in | Wt 135.0 lb

## 2017-06-18 DIAGNOSIS — M533 Sacrococcygeal disorders, not elsewhere classified: Secondary | ICD-10-CM

## 2017-06-18 NOTE — Progress Notes (Signed)
Chief complaint: Left leg numbness and tingling pain and lateral hip pain 2 months  History of present illness: Kathy Weaver is a 56 year old female, who is an established patient here in the office, who presents to the sports medicine office today with chief complaint of left leg pain as well as lateral hip pain. She reports that symptoms started approximately 2 months ago. She does not report of any inciting factor, trauma, injury to explain the pain. She does have notable history of left femur fracture status post intramedullary nailing, with also having history of L2-L5 lumbar fusion and rod placement. She reports that she is having left lateral hip pain, describes it as a sharp and burning pain, radiating down outside of leg to the front part of lower leg and dorsal aspect of left foot. She reports noticing this pain when she is having left hip flexion, points to the left lateral hip, more so over the gluteus medius region posteriorly.   She does not report of any pain at any other time. She reports having intermittent numbness and tingling down her left side, but she questions whether this is due to her history of lumbar fusion. She reports that she does have orthotics, reports to me that she does have some lateral deviation with striking. She has had orthotics made here before, most recently back in 2016. On previous gait evaluation she did have a neutral foot strike. She reports for relief of symptoms she has tried occasional Aleve, with no improvement in her symptoms. She reports that she does work out with [employer], is not sure whether the increase in activity there flared things up. She reports that she has been running a few more 10K's. She does not report of any recent changes in terms of frequency or intensity of her running regimen.  Past medical history: Palpitations Right shoulder impingement Left femur fracture, status post intramedullary nailing Lumbar spinal stenosis, status post  L2-L5 fusion  Past surgical history: Left femur intramedullary nailing Fusion of L2-L5 Breast augmentation Right shoulder arthroscopy  Family history: No reported family history of hypertension, type 2 diabetes, hyperlipidemia   Social history: She does not report of any current tobacco use, does report of occasional alcohol use, does not report of any illicit drug use   Allergies: PCN and sulfa  Review of systems: As above  Physical exam: Vital signs reviewed and are documented in chart General: Alert, oriented, appears stated age, in no apparent distress HEENT: Moist oral mucosa Respiratory: Normal respirations, able to speak in full sentences Cardiac: Normal peripheral pulses, regular rate Integumentary: No rashes on visible skin Neurologic: He does have weakness with hip abductor on left side, will categorizes as 4+/5, otherwise strength 5/5 in bilateral lower extremity, sensation 2+ in bilateral lower extremity Psychiatric: Appropriate affect and mood Musculoskeletal: Inspection of left hip reveals no obvious deformity or muscular atrophy, no warmth, erythema, ecchymosis, or effusion noted, she is slightly tender to deep palpation with hip flexion over the gluteus medius region, no tenderness over the greater trochanteric bursa or proximal IT band, she does have full hip range of motion with hip flexion, hip extension, hip internal range of motion and hip external range of motion, she does have great quadriceps and hamstring strength, straight leg negative, no obvious pes cavus or pes planus, on gait analysis she does have more forceful striking her left leg, with pronation seen in both feet   Assessment and plan: 1. Left leg pain and lateral hip pain, suspect from  SI joint dysfunction  Left leg pain and lateral hip pain -Discussed with patient that she does have tightness in her SI joint on left side, given her history of L2-L5 fusion, this does place her at higher risk  for this type of problem -Discussed physical therapy exercises for her to do at home -Did create new orthotics for her today, specifically with a heel wedge due to her left foot forceful striking and to help out with pronation  She will follow-up here in the office on as needed basis.    Mort Sawyers, MD Primary Care Sports Medicine Fellow [facility]

## 2017-10-01 DIAGNOSIS — N951 Menopausal and female climacteric states: Secondary | ICD-10-CM | POA: Diagnosis not present

## 2017-11-26 DIAGNOSIS — C44712 Basal cell carcinoma of skin of right lower limb, including hip: Secondary | ICD-10-CM | POA: Diagnosis not present

## 2017-11-26 DIAGNOSIS — L438 Other lichen planus: Secondary | ICD-10-CM | POA: Diagnosis not present

## 2017-12-17 ENCOUNTER — Other Ambulatory Visit: Payer: Self-pay | Admitting: Obstetrics and Gynecology

## 2017-12-17 DIAGNOSIS — Z1231 Encounter for screening mammogram for malignant neoplasm of breast: Secondary | ICD-10-CM

## 2017-12-17 DIAGNOSIS — Z01419 Encounter for gynecological examination (general) (routine) without abnormal findings: Secondary | ICD-10-CM | POA: Diagnosis not present

## 2017-12-17 DIAGNOSIS — Z6821 Body mass index (BMI) 21.0-21.9, adult: Secondary | ICD-10-CM | POA: Diagnosis not present

## 2018-01-27 ENCOUNTER — Ambulatory Visit
Admission: RE | Admit: 2018-01-27 | Discharge: 2018-01-27 | Disposition: A | Discharge status: Home / Self Care | Payer: BLUE CROSS/BLUE SHIELD | Source: Ambulatory Visit | Attending: Obstetrics and Gynecology | Admitting: Obstetrics and Gynecology

## 2018-01-27 DIAGNOSIS — N950 Postmenopausal bleeding: Secondary | ICD-10-CM | POA: Diagnosis not present

## 2018-01-27 DIAGNOSIS — Z1231 Encounter for screening mammogram for malignant neoplasm of breast: Secondary | ICD-10-CM | POA: Diagnosis not present

## 2018-02-05 ENCOUNTER — Other Ambulatory Visit: Payer: Self-pay | Admitting: Plastic Surgery

## 2018-02-05 DIAGNOSIS — N950 Postmenopausal bleeding: Secondary | ICD-10-CM | POA: Diagnosis not present

## 2018-02-05 DIAGNOSIS — T85898A Other specified complication of other internal prosthetic devices, implants and grafts, initial encounter: Secondary | ICD-10-CM | POA: Diagnosis not present

## 2018-02-05 DIAGNOSIS — N8500 Endometrial hyperplasia, unspecified: Secondary | ICD-10-CM | POA: Diagnosis not present

## 2018-02-05 DIAGNOSIS — R9389 Abnormal findings on diagnostic imaging of other specified body structures: Secondary | ICD-10-CM | POA: Diagnosis not present

## 2018-02-05 DIAGNOSIS — T8543XA Leakage of breast prosthesis and implant, initial encounter: Secondary | ICD-10-CM | POA: Diagnosis not present

## 2018-02-05 DIAGNOSIS — N840 Polyp of corpus uteri: Secondary | ICD-10-CM | POA: Diagnosis not present

## 2018-02-11 DIAGNOSIS — D2271 Melanocytic nevi of right lower limb, including hip: Secondary | ICD-10-CM | POA: Diagnosis not present

## 2018-02-11 DIAGNOSIS — L603 Nail dystrophy: Secondary | ICD-10-CM | POA: Diagnosis not present

## 2018-02-11 DIAGNOSIS — L821 Other seborrheic keratosis: Secondary | ICD-10-CM | POA: Diagnosis not present

## 2018-02-11 DIAGNOSIS — D225 Melanocytic nevi of trunk: Secondary | ICD-10-CM | POA: Diagnosis not present

## 2018-02-11 DIAGNOSIS — Z85828 Personal history of other malignant neoplasm of skin: Secondary | ICD-10-CM | POA: Diagnosis not present

## 2018-03-07 DIAGNOSIS — Z0184 Encounter for antibody response examination: Secondary | ICD-10-CM | POA: Diagnosis not present

## 2018-03-07 DIAGNOSIS — Z0000 Encounter for general adult medical examination without abnormal findings: Secondary | ICD-10-CM | POA: Diagnosis not present

## 2018-08-26 DIAGNOSIS — N950 Postmenopausal bleeding: Secondary | ICD-10-CM | POA: Diagnosis not present

## 2018-09-30 DIAGNOSIS — L718 Other rosacea: Secondary | ICD-10-CM | POA: Diagnosis not present

## 2018-09-30 DIAGNOSIS — L570 Actinic keratosis: Secondary | ICD-10-CM | POA: Diagnosis not present

## 2018-12-16 DIAGNOSIS — Z1321 Encounter for screening for nutritional disorder: Secondary | ICD-10-CM | POA: Diagnosis not present

## 2018-12-16 DIAGNOSIS — Z13220 Encounter for screening for lipoid disorders: Secondary | ICD-10-CM | POA: Diagnosis not present

## 2018-12-16 DIAGNOSIS — Z0000 Encounter for general adult medical examination without abnormal findings: Secondary | ICD-10-CM | POA: Diagnosis not present

## 2018-12-16 DIAGNOSIS — Z1159 Encounter for screening for other viral diseases: Secondary | ICD-10-CM | POA: Diagnosis not present

## 2018-12-23 DIAGNOSIS — Z01419 Encounter for gynecological examination (general) (routine) without abnormal findings: Secondary | ICD-10-CM | POA: Diagnosis not present

## 2018-12-23 DIAGNOSIS — Z800 Family history of malignant neoplasm of digestive organs: Secondary | ICD-10-CM | POA: Diagnosis not present

## 2018-12-23 DIAGNOSIS — Z8049 Family history of malignant neoplasm of other genital organs: Secondary | ICD-10-CM | POA: Diagnosis not present

## 2018-12-23 DIAGNOSIS — Z13820 Encounter for screening for osteoporosis: Secondary | ICD-10-CM | POA: Diagnosis not present

## 2018-12-23 DIAGNOSIS — Z808 Family history of malignant neoplasm of other organs or systems: Secondary | ICD-10-CM | POA: Diagnosis not present

## 2018-12-23 DIAGNOSIS — Z86010 Personal history of colonic polyps: Secondary | ICD-10-CM | POA: Diagnosis not present

## 2018-12-23 DIAGNOSIS — Z6820 Body mass index (BMI) 20.0-20.9, adult: Secondary | ICD-10-CM | POA: Diagnosis not present

## 2019-01-27 DIAGNOSIS — C44319 Basal cell carcinoma of skin of other parts of face: Secondary | ICD-10-CM | POA: Diagnosis not present

## 2019-01-27 DIAGNOSIS — L821 Other seborrheic keratosis: Secondary | ICD-10-CM | POA: Diagnosis not present

## 2019-01-27 DIAGNOSIS — C44311 Basal cell carcinoma of skin of nose: Secondary | ICD-10-CM | POA: Diagnosis not present

## 2019-01-27 DIAGNOSIS — D2272 Melanocytic nevi of left lower limb, including hip: Secondary | ICD-10-CM | POA: Diagnosis not present

## 2019-02-04 ENCOUNTER — Other Ambulatory Visit: Payer: Self-pay | Admitting: Obstetrics and Gynecology

## 2019-02-04 DIAGNOSIS — Z809 Family history of malignant neoplasm, unspecified: Secondary | ICD-10-CM | POA: Diagnosis not present

## 2019-02-04 DIAGNOSIS — Z1231 Encounter for screening mammogram for malignant neoplasm of breast: Secondary | ICD-10-CM

## 2019-02-18 DIAGNOSIS — C44319 Basal cell carcinoma of skin of other parts of face: Secondary | ICD-10-CM | POA: Diagnosis not present

## 2019-02-24 DIAGNOSIS — C44311 Basal cell carcinoma of skin of nose: Secondary | ICD-10-CM | POA: Diagnosis not present

## 2019-04-14 ENCOUNTER — Ambulatory Visit
Admission: RE | Admit: 2019-04-14 | Discharge: 2019-04-14 | Disposition: A | Discharge status: Home / Self Care | Payer: BC Managed Care – PPO | Source: Ambulatory Visit | Attending: Obstetrics and Gynecology | Admitting: Obstetrics and Gynecology

## 2019-04-14 ENCOUNTER — Other Ambulatory Visit: Payer: Self-pay

## 2019-04-14 DIAGNOSIS — Z1231 Encounter for screening mammogram for malignant neoplasm of breast: Secondary | ICD-10-CM | POA: Diagnosis not present

## 2019-04-14 DIAGNOSIS — H6123 Impacted cerumen, bilateral: Secondary | ICD-10-CM | POA: Diagnosis not present

## 2019-04-22 ENCOUNTER — Other Ambulatory Visit: Payer: Self-pay | Admitting: Obstetrics and Gynecology

## 2019-04-22 DIAGNOSIS — Z803 Family history of malignant neoplasm of breast: Secondary | ICD-10-CM

## 2019-04-22 DIAGNOSIS — Z9189 Other specified personal risk factors, not elsewhere classified: Secondary | ICD-10-CM

## 2019-08-25 DIAGNOSIS — D225 Melanocytic nevi of trunk: Secondary | ICD-10-CM | POA: Diagnosis not present

## 2019-08-25 DIAGNOSIS — Z85828 Personal history of other malignant neoplasm of skin: Secondary | ICD-10-CM | POA: Diagnosis not present

## 2019-08-25 DIAGNOSIS — D2261 Melanocytic nevi of right upper limb, including shoulder: Secondary | ICD-10-CM | POA: Diagnosis not present

## 2019-08-25 DIAGNOSIS — D2262 Melanocytic nevi of left upper limb, including shoulder: Secondary | ICD-10-CM | POA: Diagnosis not present

## 2019-08-25 DIAGNOSIS — C44719 Basal cell carcinoma of skin of left lower limb, including hip: Secondary | ICD-10-CM | POA: Diagnosis not present

## 2019-09-24 DIAGNOSIS — I788 Other diseases of capillaries: Secondary | ICD-10-CM | POA: Diagnosis not present

## 2019-10-13 ENCOUNTER — Encounter: Payer: Self-pay | Admitting: Sports Medicine

## 2019-10-13 ENCOUNTER — Other Ambulatory Visit: Payer: Self-pay

## 2019-10-13 ENCOUNTER — Encounter: Payer: BC Managed Care – PPO | Admitting: Sports Medicine

## 2019-10-13 ENCOUNTER — Ambulatory Visit (INDEPENDENT_AMBULATORY_CARE_PROVIDER_SITE_OTHER): Payer: BC Managed Care – PPO | Admitting: Sports Medicine

## 2019-10-13 VITALS — BP 104/66 | Ht 68.0 in | Wt 135.0 lb

## 2019-10-13 DIAGNOSIS — M216X2 Other acquired deformities of left foot: Secondary | ICD-10-CM

## 2019-10-13 DIAGNOSIS — M2142 Flat foot [pes planus] (acquired), left foot: Secondary | ICD-10-CM

## 2019-10-13 DIAGNOSIS — M2141 Flat foot [pes planus] (acquired), right foot: Secondary | ICD-10-CM | POA: Diagnosis not present

## 2019-10-13 DIAGNOSIS — M216X1 Other acquired deformities of right foot: Secondary | ICD-10-CM

## 2019-10-13 NOTE — Progress Notes (Signed)
PCP: Lavone Orn, MD  Subjective:   HPI: Patient is a 58 y.o. female here for custom orthotics.  Patient has a history of pes planus as well as pronation gait abnormality.  She had orthotics made for her 4 years ago.  Patient has a history of femur fracture with a likely discrepancy of her left being shorter than her right.  Patient comes in today to have repeat orthotics made.  She notes her last set of orthotics have been doing well however she would like a second set for new pair shoes.  She is not having any pain in her feet currently.  She denies any numbness or tingling in her feet.  She has no injury or trauma   Review of Systems: See HPI above.  Past Medical History:  Diagnosis Date  . Blood transfusion without reported diagnosis     Current Outpatient Medications on File Prior to Visit  Medication Sig Dispense Refill  . cholecalciferol (VITAMIN D) 1000 UNITS tablet Take 1,000 Units by mouth daily.    Marland Kitchen etonogestrel-ethinyl estradiol (NUVARING) 0.12-0.015 MG/24HR vaginal ring Place 1 each vaginally every 28 (twenty-eight) days. Insert vaginally and leave in place for 3 consecutive weeks, then remove for 1 week.    Marland Kitchen HYDROcodone-acetaminophen (NORCO) 5-325 MG per tablet Take 1 tablet by mouth every 6 (six) hours as needed for pain. (Patient not taking: Reported on 06/18/2017) 40 tablet 0  . ibuprofen (ADVIL,MOTRIN) 200 MG tablet Take 200 mg by mouth every 6 (six) hours as needed for pain.     No current facility-administered medications on file prior to visit.     Past Surgical History:  Procedure Laterality Date  . AUGMENTATION MAMMAPLASTY Right 09/05/2000   retro  . BACK SURGERY     spinal fusion  for L4( rods)  . BREAST SURGERY     right breast implant  . left femur fracture     1990  . SHOULDER ARTHROSCOPY Right 12/30/2012   Procedure: Arthroscopy Right Shoulder with Subacromial Decompression and Debridement;  Surgeon: Hessie Dibble, MD;  Location: [facility];  Service: Orthopedics;  Laterality: Right;    Allergies  Allergen Reactions  . Penicillins Hives  . Sulfur Swelling    Social History   Socioeconomic History  . Marital status: Married    Spouse name: Not on file  . Number of children: Not on file  . Years of education: Not on file  . Highest education level: Not on file  Occupational History  . Not on file  Social Needs  . Financial resource strain: Not on file  . Food insecurity    Worry: Not on file    Inability: Not on file  . Transportation needs    Medical: Not on file    Non-medical: Not on file  Tobacco Use  . Smoking status: Never Smoker  Substance and Sexual Activity  . Alcohol use: Yes    Comment: wine 4 days a week  . Drug use: No  . Sexual activity: Yes    Comment: nuvaring  Lifestyle  . Physical activity    Days per week: Not on file    Minutes per session: Not on file  . Stress: Not on file  Relationships  . Social Herbalist on phone: Not on file    Gets together: Not on file    Attends religious service: Not on file    Active member of club or organization: Not on file  Attends meetings of clubs or organizations: Not on file    Relationship status: Not on file  . Intimate partner violence    Fear of current or ex partner: Not on file    Emotionally abused: Not on file    Physically abused: Not on file    Forced sexual activity: Not on file  Other Topics Concern  . Not on file  Social History Narrative  . Not on file    History reviewed. No pertinent family history.      Objective:  Physical Exam: There were no vitals taken for this visit. Gen: NAD, comfortable in exam room Lungs: Breathing comfortably on room air Ankle/Foot Exam Bilateral -Inspection: Flattening of both longitudinal and transverse arches bilaterally -Palpation: No tenderness palpation -ROM: Normal ROM with dorsiflexion, plantarflexion, inversion, eversion -Strength: Dorsiflexion: 5/5;  Plantarflexion: 5/5; Inversion: 5/5; Eversion: 5/5 -Limb neurovascularly intact, no instability noted   -Gait: Pronation gait abnormality    Assessment & Plan:  Patient is a 58 y.o. female here for custom orthotics  1.  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 The orthotic was heated and afterward the patient stood on the orthotic blank positioned on the orthotic stand. The patient was positioned in subtalar neutral position and 10 degrees of ankle dorsiflexion in a weight bearing stance. After completion of molding, a stable base was applied to the orthotic blank. The blank was ground to a stable position for weight bearing. Size: 9 Base: Medium density Blue EVA Posting: None Additional orthotic padding: Heel lift on the left  Patient's gait was observed following custom Occupational hygienist.  Patient had correction of her pronation gait abnormality.  She was instructed to wear the orthotics for the next several weeks.  If she needs adjustments she will follow-up for that as needed

## 2019-10-14 ENCOUNTER — Encounter: Payer: Self-pay | Admitting: Sports Medicine

## 2019-10-27 ENCOUNTER — Other Ambulatory Visit: Payer: Self-pay | Admitting: Obstetrics and Gynecology

## 2019-11-03 ENCOUNTER — Ambulatory Visit
Admission: RE | Admit: 2019-11-03 | Discharge: 2019-11-03 | Disposition: A | Discharge status: Home / Self Care | Payer: BC Managed Care – PPO | Source: Ambulatory Visit | Attending: Obstetrics and Gynecology | Admitting: Obstetrics and Gynecology

## 2019-11-03 ENCOUNTER — Other Ambulatory Visit: Payer: Self-pay

## 2019-11-03 DIAGNOSIS — Z9189 Other specified personal risk factors, not elsewhere classified: Secondary | ICD-10-CM

## 2019-11-03 DIAGNOSIS — Z803 Family history of malignant neoplasm of breast: Secondary | ICD-10-CM | POA: Diagnosis not present

## 2019-11-03 MED ORDER — GADOBUTROL 1 MMOL/ML IV SOLN
6.0000 mL | Freq: Once | INTRAVENOUS | Status: AC | PRN
  Administered 2019-11-03: 6 mL via INTRAVENOUS

## 2019-11-05 ENCOUNTER — Other Ambulatory Visit: Payer: Self-pay | Admitting: Obstetrics and Gynecology

## 2019-11-05 DIAGNOSIS — R9389 Abnormal findings on diagnostic imaging of other specified body structures: Secondary | ICD-10-CM

## 2019-11-05 DIAGNOSIS — N6310 Unspecified lump in the right breast, unspecified quadrant: Secondary | ICD-10-CM

## 2019-11-10 ENCOUNTER — Ambulatory Visit
Admission: RE | Admit: 2019-11-10 | Discharge: 2019-11-10 | Disposition: A | Discharge status: Home / Self Care | Payer: BC Managed Care – PPO | Source: Ambulatory Visit | Attending: Obstetrics and Gynecology | Admitting: Obstetrics and Gynecology

## 2019-11-10 ENCOUNTER — Other Ambulatory Visit: Payer: Self-pay | Admitting: Obstetrics and Gynecology

## 2019-11-10 ENCOUNTER — Other Ambulatory Visit: Payer: Self-pay

## 2019-11-10 DIAGNOSIS — N6311 Unspecified lump in the right breast, upper outer quadrant: Secondary | ICD-10-CM | POA: Diagnosis not present

## 2019-11-10 DIAGNOSIS — R9389 Abnormal findings on diagnostic imaging of other specified body structures: Secondary | ICD-10-CM

## 2019-11-10 DIAGNOSIS — N6310 Unspecified lump in the right breast, unspecified quadrant: Secondary | ICD-10-CM

## 2020-01-01 DIAGNOSIS — E8941 Symptomatic postprocedural ovarian failure: Secondary | ICD-10-CM | POA: Diagnosis not present

## 2020-01-01 DIAGNOSIS — Z6820 Body mass index (BMI) 20.0-20.9, adult: Secondary | ICD-10-CM | POA: Diagnosis not present

## 2020-01-01 DIAGNOSIS — Z01419 Encounter for gynecological examination (general) (routine) without abnormal findings: Secondary | ICD-10-CM | POA: Diagnosis not present

## 2020-02-23 DIAGNOSIS — D225 Melanocytic nevi of trunk: Secondary | ICD-10-CM | POA: Diagnosis not present

## 2020-02-23 DIAGNOSIS — Z85828 Personal history of other malignant neoplasm of skin: Secondary | ICD-10-CM | POA: Diagnosis not present

## 2020-02-23 DIAGNOSIS — L814 Other melanin hyperpigmentation: Secondary | ICD-10-CM | POA: Diagnosis not present

## 2020-02-23 DIAGNOSIS — L720 Epidermal cyst: Secondary | ICD-10-CM | POA: Diagnosis not present

## 2020-02-23 DIAGNOSIS — L821 Other seborrheic keratosis: Secondary | ICD-10-CM | POA: Diagnosis not present

## 2020-04-27 ENCOUNTER — Other Ambulatory Visit: Payer: Self-pay | Admitting: Obstetrics and Gynecology

## 2020-04-27 DIAGNOSIS — N6310 Unspecified lump in the right breast, unspecified quadrant: Secondary | ICD-10-CM

## 2020-04-29 ENCOUNTER — Other Ambulatory Visit: Payer: Self-pay | Admitting: Obstetrics and Gynecology

## 2020-04-29 DIAGNOSIS — Z1231 Encounter for screening mammogram for malignant neoplasm of breast: Secondary | ICD-10-CM

## 2020-05-03 ENCOUNTER — Other Ambulatory Visit: Payer: Self-pay

## 2020-05-03 ENCOUNTER — Ambulatory Visit
Admission: RE | Admit: 2020-05-03 | Discharge: 2020-05-03 | Disposition: A | Discharge status: Home / Self Care | Payer: BC Managed Care – PPO | Source: Ambulatory Visit | Attending: Obstetrics and Gynecology | Admitting: Obstetrics and Gynecology

## 2020-05-03 DIAGNOSIS — Z1231 Encounter for screening mammogram for malignant neoplasm of breast: Secondary | ICD-10-CM

## 2020-06-03 DIAGNOSIS — Z1152 Encounter for screening for COVID-19: Secondary | ICD-10-CM | POA: Diagnosis not present

## 2020-06-07 ENCOUNTER — Other Ambulatory Visit: Payer: BC Managed Care – PPO

## 2020-06-14 ENCOUNTER — Other Ambulatory Visit: Payer: BC Managed Care – PPO

## 2020-07-05 DIAGNOSIS — R194 Change in bowel habit: Secondary | ICD-10-CM | POA: Diagnosis not present

## 2020-07-05 DIAGNOSIS — R140 Abdominal distension (gaseous): Secondary | ICD-10-CM | POA: Diagnosis not present

## 2020-07-05 DIAGNOSIS — Z86010 Personal history of colonic polyps: Secondary | ICD-10-CM | POA: Diagnosis not present

## 2020-07-12 ENCOUNTER — Other Ambulatory Visit: Payer: BC Managed Care – PPO

## 2020-07-21 ENCOUNTER — Other Ambulatory Visit: Payer: Self-pay | Admitting: Obstetrics and Gynecology

## 2020-07-21 DIAGNOSIS — N6310 Unspecified lump in the right breast, unspecified quadrant: Secondary | ICD-10-CM

## 2020-08-02 ENCOUNTER — Ambulatory Visit
Admission: RE | Admit: 2020-08-02 | Discharge: 2020-08-02 | Disposition: A | Discharge status: Home / Self Care | Payer: BC Managed Care – PPO | Source: Ambulatory Visit | Attending: Obstetrics and Gynecology | Admitting: Obstetrics and Gynecology

## 2020-08-02 ENCOUNTER — Other Ambulatory Visit: Payer: Self-pay

## 2020-08-02 DIAGNOSIS — N6489 Other specified disorders of breast: Secondary | ICD-10-CM | POA: Diagnosis not present

## 2020-08-02 DIAGNOSIS — N6310 Unspecified lump in the right breast, unspecified quadrant: Secondary | ICD-10-CM

## 2020-08-12 DIAGNOSIS — Z20822 Contact with and (suspected) exposure to covid-19: Secondary | ICD-10-CM | POA: Diagnosis not present

## 2020-08-12 DIAGNOSIS — U071 COVID-19: Secondary | ICD-10-CM | POA: Diagnosis not present

## 2020-09-13 DIAGNOSIS — R102 Pelvic and perineal pain: Secondary | ICD-10-CM | POA: Diagnosis not present

## 2020-12-20 DIAGNOSIS — Z0000 Encounter for general adult medical examination without abnormal findings: Secondary | ICD-10-CM | POA: Diagnosis not present

## 2020-12-27 DIAGNOSIS — Z20828 Contact with and (suspected) exposure to other viral communicable diseases: Secondary | ICD-10-CM | POA: Diagnosis not present

## 2021-01-20 DIAGNOSIS — M79671 Pain in right foot: Secondary | ICD-10-CM | POA: Diagnosis not present

## 2021-02-28 DIAGNOSIS — L720 Epidermal cyst: Secondary | ICD-10-CM | POA: Diagnosis not present

## 2021-02-28 DIAGNOSIS — Z85828 Personal history of other malignant neoplasm of skin: Secondary | ICD-10-CM | POA: Diagnosis not present

## 2021-02-28 DIAGNOSIS — L821 Other seborrheic keratosis: Secondary | ICD-10-CM | POA: Diagnosis not present

## 2021-02-28 DIAGNOSIS — D2261 Melanocytic nevi of right upper limb, including shoulder: Secondary | ICD-10-CM | POA: Diagnosis not present

## 2021-03-17 DIAGNOSIS — Z20828 Contact with and (suspected) exposure to other viral communicable diseases: Secondary | ICD-10-CM | POA: Diagnosis not present

## 2021-03-21 DIAGNOSIS — G4700 Insomnia, unspecified: Secondary | ICD-10-CM | POA: Diagnosis not present

## 2021-03-21 DIAGNOSIS — E8941 Symptomatic postprocedural ovarian failure: Secondary | ICD-10-CM | POA: Diagnosis not present

## 2021-03-21 DIAGNOSIS — Z6820 Body mass index (BMI) 20.0-20.9, adult: Secondary | ICD-10-CM | POA: Diagnosis not present

## 2021-03-21 DIAGNOSIS — Z13820 Encounter for screening for osteoporosis: Secondary | ICD-10-CM | POA: Diagnosis not present

## 2021-03-21 DIAGNOSIS — Z01419 Encounter for gynecological examination (general) (routine) without abnormal findings: Secondary | ICD-10-CM | POA: Diagnosis not present

## 2021-05-15 ENCOUNTER — Other Ambulatory Visit: Payer: Self-pay | Admitting: Internal Medicine

## 2021-05-15 DIAGNOSIS — Z8249 Family history of ischemic heart disease and other diseases of the circulatory system: Secondary | ICD-10-CM

## 2021-06-20 ENCOUNTER — Ambulatory Visit
Admission: RE | Admit: 2021-06-20 | Discharge: 2021-06-20 | Disposition: A | Discharge status: Home / Self Care | Payer: Self-pay | Source: Ambulatory Visit | Attending: Internal Medicine | Admitting: Internal Medicine

## 2021-06-20 DIAGNOSIS — Z8249 Family history of ischemic heart disease and other diseases of the circulatory system: Secondary | ICD-10-CM

## 2021-06-23 ENCOUNTER — Other Ambulatory Visit: Payer: Self-pay | Admitting: Internal Medicine

## 2021-06-23 DIAGNOSIS — Z1231 Encounter for screening mammogram for malignant neoplasm of breast: Secondary | ICD-10-CM

## 2021-07-25 ENCOUNTER — Other Ambulatory Visit: Payer: Self-pay

## 2021-07-25 ENCOUNTER — Ambulatory Visit
Admission: RE | Admit: 2021-07-25 | Discharge: 2021-07-25 | Disposition: A | Discharge status: Home / Self Care | Payer: BLUE CROSS/BLUE SHIELD | Source: Ambulatory Visit | Attending: Internal Medicine | Admitting: Internal Medicine

## 2021-07-25 DIAGNOSIS — Z1211 Encounter for screening for malignant neoplasm of colon: Secondary | ICD-10-CM | POA: Diagnosis not present

## 2021-07-25 DIAGNOSIS — Z800 Family history of malignant neoplasm of digestive organs: Secondary | ICD-10-CM | POA: Diagnosis not present

## 2021-07-25 DIAGNOSIS — Z1231 Encounter for screening mammogram for malignant neoplasm of breast: Secondary | ICD-10-CM

## 2021-07-25 DIAGNOSIS — Z86010 Personal history of colonic polyps: Secondary | ICD-10-CM | POA: Diagnosis not present

## 2021-08-02 ENCOUNTER — Other Ambulatory Visit: Payer: Self-pay | Admitting: Internal Medicine

## 2021-08-02 DIAGNOSIS — R928 Other abnormal and inconclusive findings on diagnostic imaging of breast: Secondary | ICD-10-CM

## 2021-08-17 ENCOUNTER — Other Ambulatory Visit: Payer: Self-pay | Admitting: Obstetrics and Gynecology

## 2021-08-17 DIAGNOSIS — R928 Other abnormal and inconclusive findings on diagnostic imaging of breast: Secondary | ICD-10-CM

## 2021-08-18 ENCOUNTER — Ambulatory Visit
Admission: RE | Admit: 2021-08-18 | Discharge: 2021-08-18 | Disposition: A | Discharge status: Home / Self Care | Payer: BLUE CROSS/BLUE SHIELD | Source: Ambulatory Visit | Attending: Internal Medicine | Admitting: Internal Medicine

## 2021-08-18 ENCOUNTER — Other Ambulatory Visit: Payer: Self-pay

## 2021-08-18 ENCOUNTER — Other Ambulatory Visit: Payer: Self-pay | Admitting: Obstetrics and Gynecology

## 2021-08-18 DIAGNOSIS — R928 Other abnormal and inconclusive findings on diagnostic imaging of breast: Secondary | ICD-10-CM

## 2021-08-18 DIAGNOSIS — R922 Inconclusive mammogram: Secondary | ICD-10-CM | POA: Diagnosis not present

## 2021-08-18 DIAGNOSIS — N6002 Solitary cyst of left breast: Secondary | ICD-10-CM | POA: Diagnosis not present

## 2021-08-21 ENCOUNTER — Other Ambulatory Visit: Payer: BLUE CROSS/BLUE SHIELD

## 2021-08-25 ENCOUNTER — Ambulatory Visit
Admission: RE | Admit: 2021-08-25 | Discharge: 2021-08-25 | Disposition: A | Discharge status: Home / Self Care | Payer: BLUE CROSS/BLUE SHIELD | Source: Ambulatory Visit | Attending: Obstetrics and Gynecology | Admitting: Obstetrics and Gynecology

## 2021-08-25 ENCOUNTER — Other Ambulatory Visit: Payer: Self-pay

## 2021-08-25 DIAGNOSIS — R928 Other abnormal and inconclusive findings on diagnostic imaging of breast: Secondary | ICD-10-CM

## 2021-08-25 DIAGNOSIS — N6323 Unspecified lump in the left breast, lower outer quadrant: Secondary | ICD-10-CM | POA: Diagnosis not present

## 2021-08-29 ENCOUNTER — Ambulatory Visit (INDEPENDENT_AMBULATORY_CARE_PROVIDER_SITE_OTHER): Payer: BLUE CROSS/BLUE SHIELD | Admitting: Cardiology

## 2021-08-29 ENCOUNTER — Other Ambulatory Visit: Payer: Self-pay

## 2021-08-29 DIAGNOSIS — I7121 Aneurysm of the ascending aorta, without rupture: Secondary | ICD-10-CM | POA: Diagnosis not present

## 2021-08-29 NOTE — Assessment & Plan Note (Addendum)
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  We will check an echocardiogram to ensure that there is no evidence of bicuspid aortic valve.  No murmurs appreciated today.  No coarctation on CT.  No early family history of aortic rupture.  Continue with excellent blood pressure control, exercise.  She does not have any room for beta-blockers given her resting heart rate of 49 bpm.  Blood pressure is excellent.  Try to avoid excessive abrupt heavy lifting.  Avoid Cirpo. Given her and her brothers pectus excavatum, and her tall thin stature, we will have her referred to Dr. Lattie Corns for genetic counseling and possible genetic testing of fibrillin gene defect for Marfan's.  She does not have any children.  Her brother however does have children.  Both her mother and father have not had any aortopathy's.  Her father has had coronary artery disease treated with CABG.  Remember, she had a calcium score of 0
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
## 2021-08-29 NOTE — Patient Instructions (Signed)
Medication Instructions:   Your physician recommends that you continue on your current medications as directed. Please refer to the Current Medication list given to you today.  *If you need a refill on your cardiac medications before your next appointment, please call your pharmacy*   You have been referred to DR. Lattie Corns GENETICIST HERE WITH OUR GROUP--YOU WILL BE CALLED BY HER SCHEDULER TO [place].   Testing/Procedures:  Your physician has requested that you have an echocardiogram. Echocardiography is a painless test that uses sound waves to create images of your heart. It provides your doctor with information about the size and shape of your heart and how well your heart's chambers and valves are working. This procedure takes approximately one hour. There are no restrictions for this procedure.   Follow-Up: At [facility], you and your health needs are our priority.  As part of our continuing mission to provide you with exceptional heart care, we have created designated Provider Care Teams.  These Care Teams include your primary Cardiologist (physician) and Advanced Practice Providers (APPs -  Physician Assistants and Nurse Practitioners) who all work together to provide you with the care you need, when you need it.  We recommend signing up for the patient portal called "MyChart".  Sign up information is provided on this After Visit Summary.  MyChart is used to connect with patients for Virtual Visits (Telemedicine).  Patients are able to view lab/test results, encounter notes, upcoming appointments, etc.  Non-urgent messages can be sent to your provider as well.   To learn more about what you can do with MyChart, go to [email or web address].    Your next appointment:   1 year(s)  The format for your next appointment:   In Person  Provider:   Candee Furbish, MD

## 2021-09-15 ENCOUNTER — Ambulatory Visit (INDEPENDENT_AMBULATORY_CARE_PROVIDER_SITE_OTHER): Payer: BLUE CROSS/BLUE SHIELD

## 2021-09-15 ENCOUNTER — Encounter: Payer: Self-pay | Admitting: Podiatry

## 2021-09-15 ENCOUNTER — Ambulatory Visit (INDEPENDENT_AMBULATORY_CARE_PROVIDER_SITE_OTHER): Payer: BLUE CROSS/BLUE SHIELD | Admitting: Podiatry

## 2021-09-15 ENCOUNTER — Other Ambulatory Visit: Payer: Self-pay

## 2021-09-15 DIAGNOSIS — M7671 Peroneal tendinitis, right leg: Secondary | ICD-10-CM | POA: Diagnosis not present

## 2021-09-15 DIAGNOSIS — M79671 Pain in right foot: Secondary | ICD-10-CM

## 2021-09-18 NOTE — Progress Notes (Signed)
Subjective:   Patient ID: Kathy Weaver, female   DOB: 60 y.o.   MRN: [id number]   HPI Patient presents stating she is getting pain in the outside of her right foot and its been present 7 or 8 months.  It only occurs at sporadic periods of time and it is difficult to develop any consistency to when it occurs and she states that she has had previous x-rays which were negative and has not done any significant treatment up to this point and just cannot be barefoot.  Patient does not smoke likes to be active   Review of Systems  All other systems reviewed and are negative.      Objective:  Physical Exam Vitals and nursing note reviewed.  Constitutional:      Appearance: She is well-developed.  Pulmonary:     Effort: Pulmonary effort is normal.  Musculoskeletal:        General: Normal range of motion.  Skin:    General: Skin is warm.  Neurological:     Mental Status: She is alert.    Neurovascular status found to be intact muscle strength was found to be adequate range of motion adequate.  There is pain in the lateral side of the right foot of a low-grade nature with no edema associated with it and I did not note any muscle strength loss or muscle strength pathology.  There is no apparent significant injury to the area and I was not able to ascertain any other symptoms currently     Assessment:  Low-grade tendinitis or inflammatory condition lateral side right foot with no apparent organic issue     Plan:  H&P reviewed condition.  I did discuss complete immobilization along with steroid injection to try to reduce any inflammatory condition along with rest and she does not want to take this approach at this point.  I did discuss MRI and she does not want this at this point and at this time and she is just not good to go barefoot and I do think over time this still may want to resolve on its own but if not we will have to consider 1 of those options and also the consideration of  physical therapy which I suggested to her today

## 2021-09-19 ENCOUNTER — Other Ambulatory Visit: Payer: Self-pay

## 2021-09-19 ENCOUNTER — Ambulatory Visit (HOSPITAL_COMMUNITY): Payer: BLUE CROSS/BLUE SHIELD | Attending: Cardiovascular Disease

## 2021-09-19 DIAGNOSIS — I7121 Aneurysm of the ascending aorta, without rupture: Secondary | ICD-10-CM | POA: Diagnosis not present

## 2021-09-19 LAB — ECHOCARDIOGRAM COMPLETE
Area-P 1/2: 2.07 cm2
S' Lateral: 3.3 cm

## 2021-10-16 DIAGNOSIS — D125 Benign neoplasm of sigmoid colon: Secondary | ICD-10-CM | POA: Diagnosis not present

## 2021-10-16 DIAGNOSIS — Z1211 Encounter for screening for malignant neoplasm of colon: Secondary | ICD-10-CM | POA: Diagnosis not present

## 2021-10-16 DIAGNOSIS — K635 Polyp of colon: Secondary | ICD-10-CM | POA: Diagnosis not present

## 2021-10-16 DIAGNOSIS — Z800 Family history of malignant neoplasm of digestive organs: Secondary | ICD-10-CM | POA: Diagnosis not present

## 2021-10-23 ENCOUNTER — Telehealth: Payer: Self-pay | Admitting: *Deleted

## 2021-10-23 DIAGNOSIS — I7121 Aneurysm of the ascending aorta, without rupture: Secondary | ICD-10-CM

## 2021-10-23 DIAGNOSIS — Z01812 Encounter for preprocedural laboratory examination: Secondary | ICD-10-CM

## 2021-10-23 NOTE — Telephone Encounter (Signed)
Kathy Pain, MD  09/20/2021  7:05 AM EST     Normal pump function Aortic valve is tricuspid Measurement on echo was 39 mm aortic root and 37 mm ascending aorta (mild) In one year, we will check a CTA of chest to compare.  Candee Furbish, MD    Orders placed for CTA and BMP (if needed)

## 2021-11-14 ENCOUNTER — Other Ambulatory Visit: Payer: Self-pay

## 2021-11-14 ENCOUNTER — Ambulatory Visit: Payer: BC Managed Care – PPO | Admitting: Genetic Counselor

## 2021-11-14 ENCOUNTER — Other Ambulatory Visit: Payer: BLUE CROSS/BLUE SHIELD

## 2021-11-15 NOTE — Progress Notes (Signed)
Referring Provider: Candee Furbish, MD   Referral Reason Kashlynn Kundert was referred for genetic consult and testing of Aortopathies subsequent to imaging studies that noted dilatation of the aortic root of 3.9 cm to 4.1 cm and clinical features suspicious of Marfan syndrome.   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 A year later, she was seen by a cardiologist and underwent Calcium scoring that computed her AoD at 4.2 cm and Ca score of 0. Recently she underwent an. 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 She is asymptomatic and leads a very active life with no functional limitations. She informs me of having mild pectus excavatum and tall- height is 69ft 8 inches but states that her brothers are taller with heights of 6'2 and 6'5. She does not have any of the other pathognomonic features of Marfan syndrome.  Traditional Risk Factors Muskaan denies having atherosclerosis, hypertension and prior aortic dissection that can also lead to AoD.  Family history Annella (III.3) is the youngest of three- she has two older brothers, ages, 52 and 33 (III.1, III.2). Her 91 y.o. brother had repair of his mitral valve prolapse and like her also has pectus excavatum. The middle brother, age 104 (III.2) has arrhythmias with bradycardia and bigeminy. Amare and her middle brother do not have children.   Roshelle's father (II.3) underwent a 9-vessel cardiac bypass surgery at age 18. He died at 86 from traumatic brain injury after a fall. He had 2 sisters- one died at 43 from possible CHF (II.1). and the other sister is now age 63 (II.2). Paternal grandfather (I.1) died of complications of gall bladder surgery at 44 and grandmother (I.2) died of CHF at 41.  Kailoni's mother (i.4) died at 40 from carcinoid tumor. She has a  younger brother (II.5) who died in his 32s from polycythemia vera. Maternal grandfather (I.3) died at 2 from a heart attack and grandmother (I.4) died in a motor vehicle accident at age 69. Ashlin tells me that she was a pedestrian and was hit by a car.  Pre-Test Genetic Consultation Notes  Relda was counseled on the genetics of syndromes that present with aortic aneurysms and dissections, specifically Marfan syndrome (MFS), Loeys-Dietz syndrome (LDS), Vascular Ehlers-Danlos Syndrome (vEDS) and other non-syndromic familial forms of thoracic aortic aneurysms and dissection (FTAAD). I explained to her that aortopathies are an autosomal dominant condition that has high penetrance and de novo mutation rate. In the absence of a family history of sudden death, hollow organ ruptures or aortic dissections, it is likely that she has a de novo mutation or that the dilatation is a natural variant given her tall stature. She verbalized understanding of this.   I discussed incomplete penetrance associated with this condition i.e. not all individuals harboring a mutation will present clinically, and age-related penetrance where clinical presentation increases with advanced age. I also reviewed variable expression and emphasized that this condition can express at any age at any level of severity in the family. It would be prudent for her first-degree relatives to undergo CTA screening for aneurysms. She verbalized understanding of this.   We walked through the process of genetic testing.   I explained to her that genetic testing is a probabilistic test dependent upon her age and severity of presentation, presence of risk factors and importantly family history of aortic  aneurysms or sudden death in first-degree relatives.  The potential outcomes of genetic testing and subsequent management of at-risk family members were discussed so as to manage expectations-  I explained to her that if a mutation is not  identified, then all first-degree relatives should undergo cardiac screening for aortic aneurysms. While nearly 11% of AoD cases are idiopathic, it is likely that hers is idiopathic. She verbalized understanding of this.  There is also the likelihood of identifying a Variant of unknown significance. This result means that the variant has not been detected in a statistically significant number of patients and/or functional studies have not been performed to verify its pathogenicity. This VUS can be tested in the family to see if it segregates with disease. If a VUS is found, first-degree relatives should get screened.  If a pathogenic variant is reported, then her first-degree family members can get tested for this variant. If they test positive, it is likely they will develop an aortic aneurysm. In light of variable expression and incomplete penetrance associated with this condition, it is not possible to predict when they will manifest clinically with an aneurysm. It is recommended that family members that test positive for the familial pathogenic variant pursue clinical screening.  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 She does not present with pathognomonic features of Marfan syndrome. Additionally, there is no family history of sudden death, aortic dissection, brain aneurysms or hollow organ rupture.   In addition, Donique should be aware of the protections afforded by the Genetic Information Non-Discrimination Act (GINA). GINA protects her from losing employment or health insurance based on her genotype. However, these protections do not cover life insurance and disability.   Please note that the patient has not been counseled in this visit on personal, cultural or ethical issues that she may face due to her heart condition.   Plan As she does not have children and there is no family history of aortic dilatation and aneurysms or  sudden death, the genetic test for aortopathies has limited clinical utility. We can revisit genetic testing, if her siblings were also found to have an AoD or she obtains additional information regarding this in her family. Emphasized that it is important she undergoes regular screening of her AoD. She verbalized understanding.  Lattie Corns, Ph.D, [facility] Clinical Molecular Geneticist

## 2021-11-21 DIAGNOSIS — L723 Sebaceous cyst: Secondary | ICD-10-CM | POA: Diagnosis not present

## 2022-02-20 DIAGNOSIS — L814 Other melanin hyperpigmentation: Secondary | ICD-10-CM | POA: Diagnosis not present

## 2022-02-20 DIAGNOSIS — D225 Melanocytic nevi of trunk: Secondary | ICD-10-CM | POA: Diagnosis not present

## 2022-02-20 DIAGNOSIS — D2262 Melanocytic nevi of left upper limb, including shoulder: Secondary | ICD-10-CM | POA: Diagnosis not present

## 2022-02-20 DIAGNOSIS — C44712 Basal cell carcinoma of skin of right lower limb, including hip: Secondary | ICD-10-CM | POA: Diagnosis not present

## 2022-02-20 DIAGNOSIS — L821 Other seborrheic keratosis: Secondary | ICD-10-CM | POA: Diagnosis not present

## 2022-02-26 IMAGING — MG MM DIGITAL DIAGNOSTIC UNILAT*L* IMPLANT W/ TOMO W/ CAD
8 series · 8 of 24 positions shown · non-contrast
Comparison: Previous exam(s).

CLINICAL DATA: Screening recall for a possible left breast mass.

EXAM:
DIGITAL DIAGNOSTIC UNILATERAL LEFT MAMMOGRAM WITH IMPLANTS, CAD AND
TOMOSYNTHESIS; ULTRASOUND LEFT BREAST LIMITED
TECHNIQUE: Left digital diagnostic mammography and breast tomosynthesis was
performed. The images were evaluated with computer-aided detection.
Standard and/or implant displaced views were performed.; Targeted
ultrasound examination of the left breast was performed.

[L XCCL synth-2D]
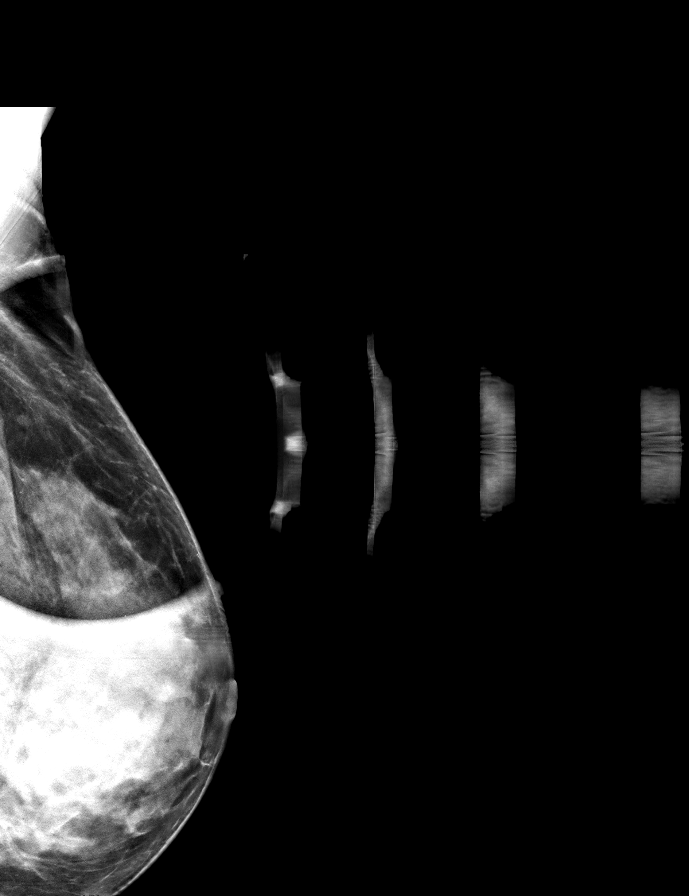

[L CC synth-2D]
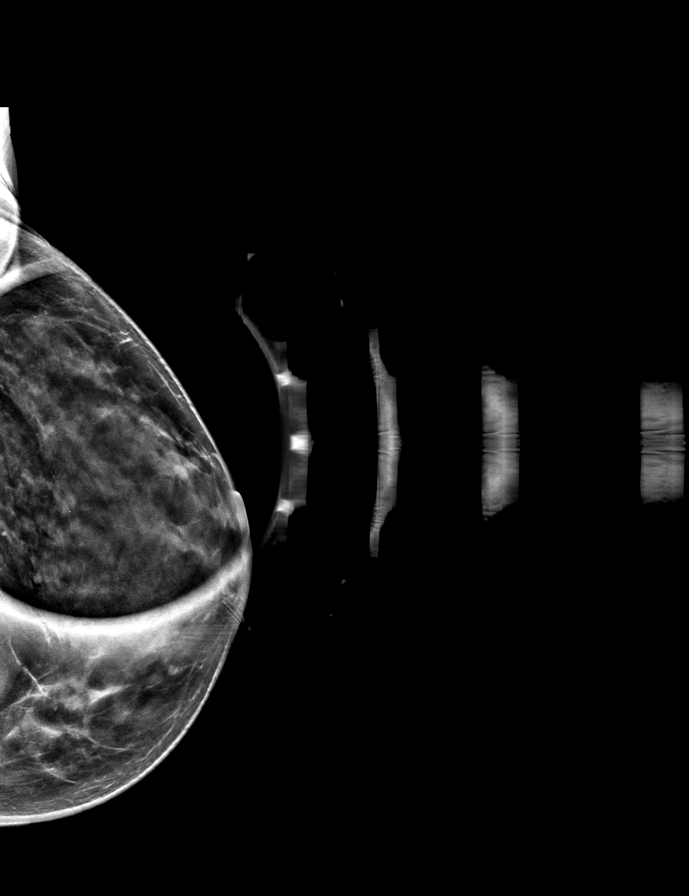

[L MLO synth-2D]
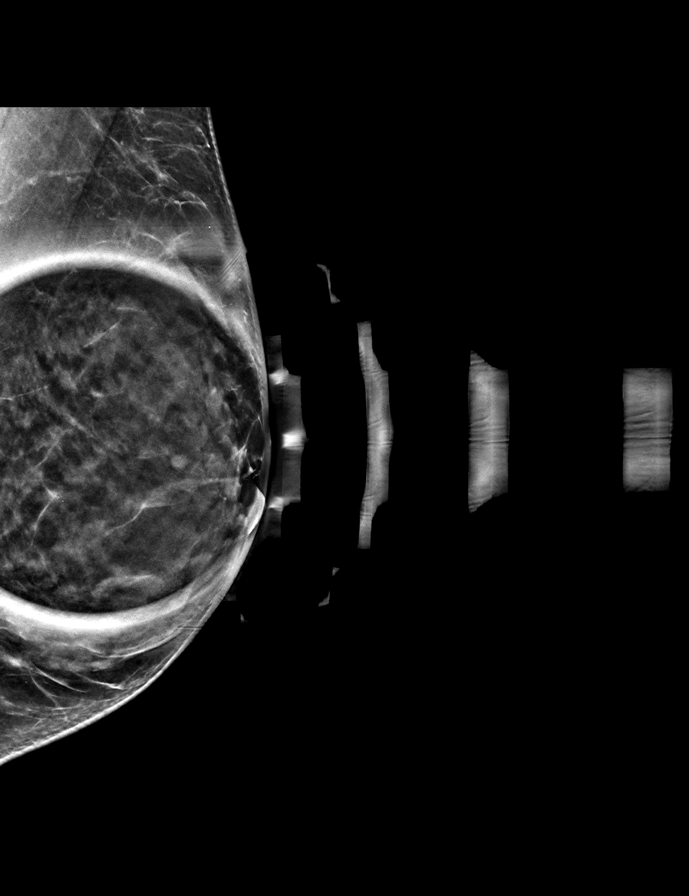

[L ML synth-2D]
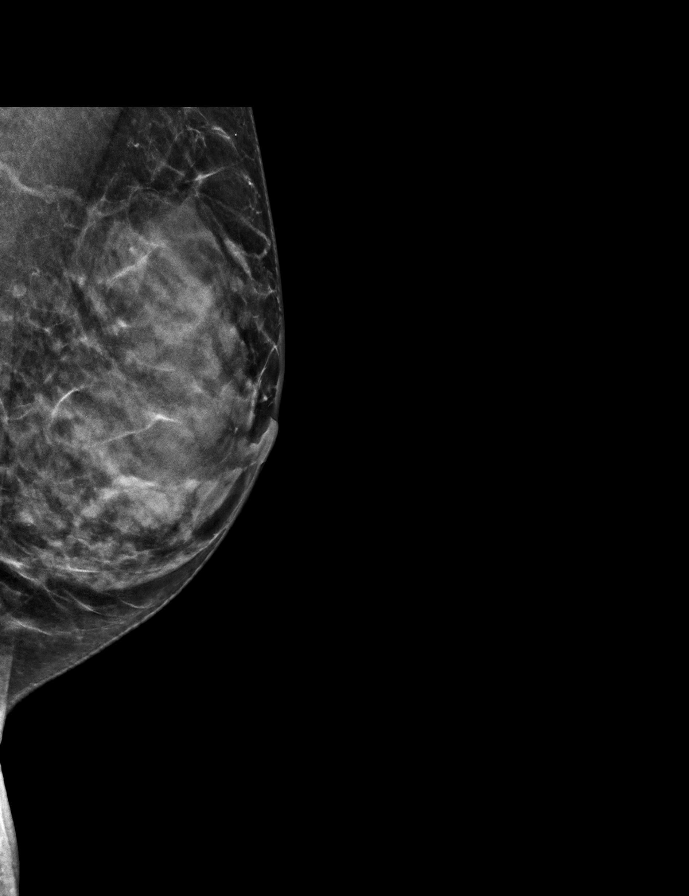

[L CCID BREAST TOMOSYNTHESIS IMAGE tomo · tomo slice 30/59.0]
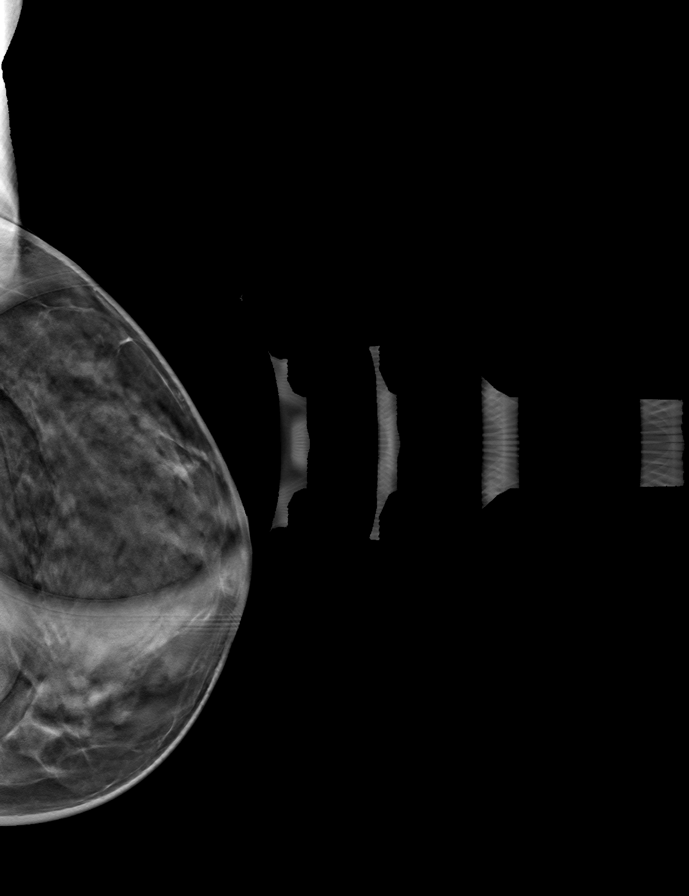

[L XCCL tomo · tomo slice 31/62.0]
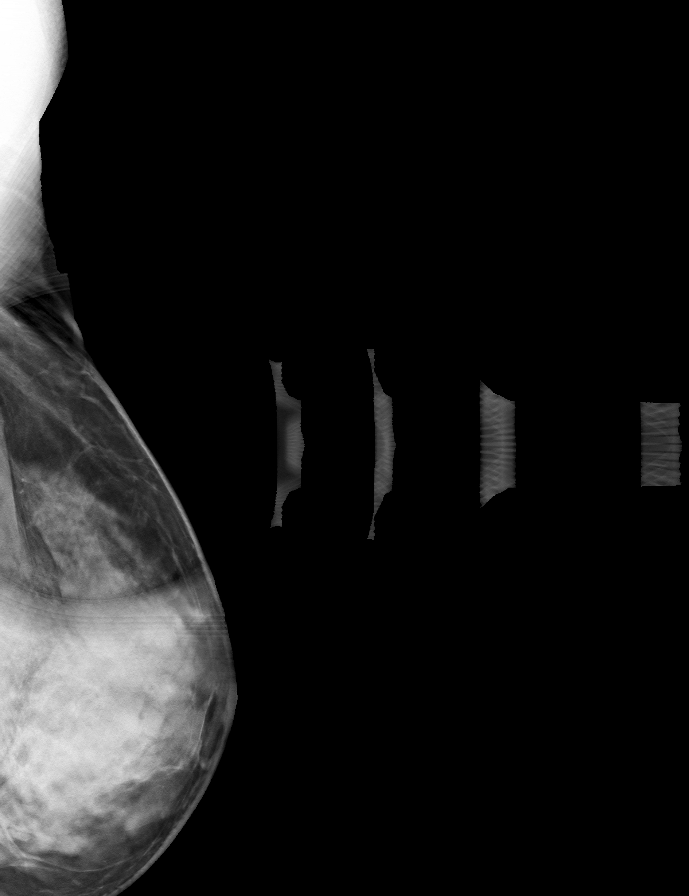

[L MLOID BREAST TOMOSYNTHESIS IMAGE tomo · tomo slice 25/48.0]
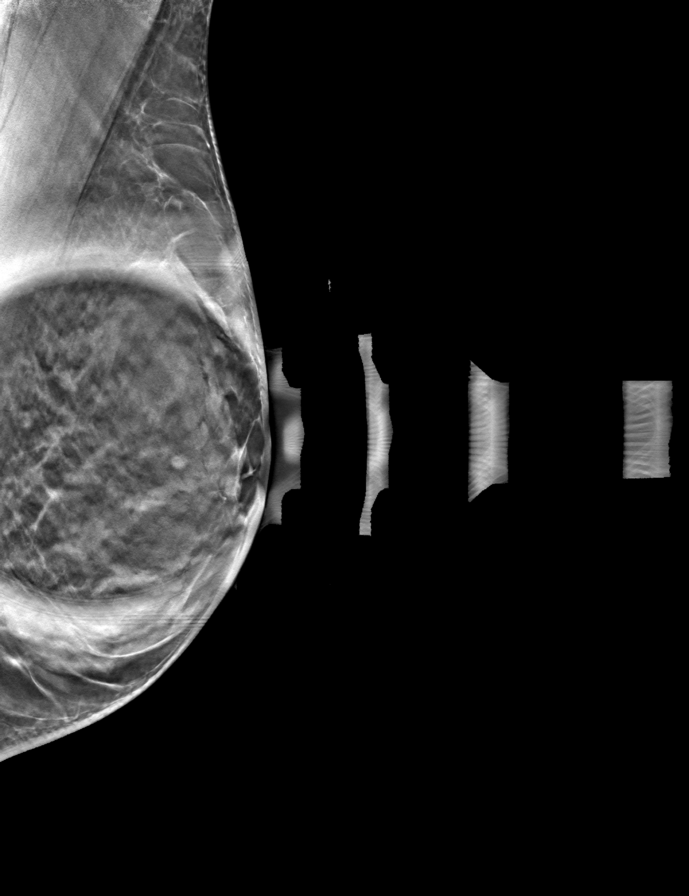

[L ML tomo · tomo slice 27/52.0]
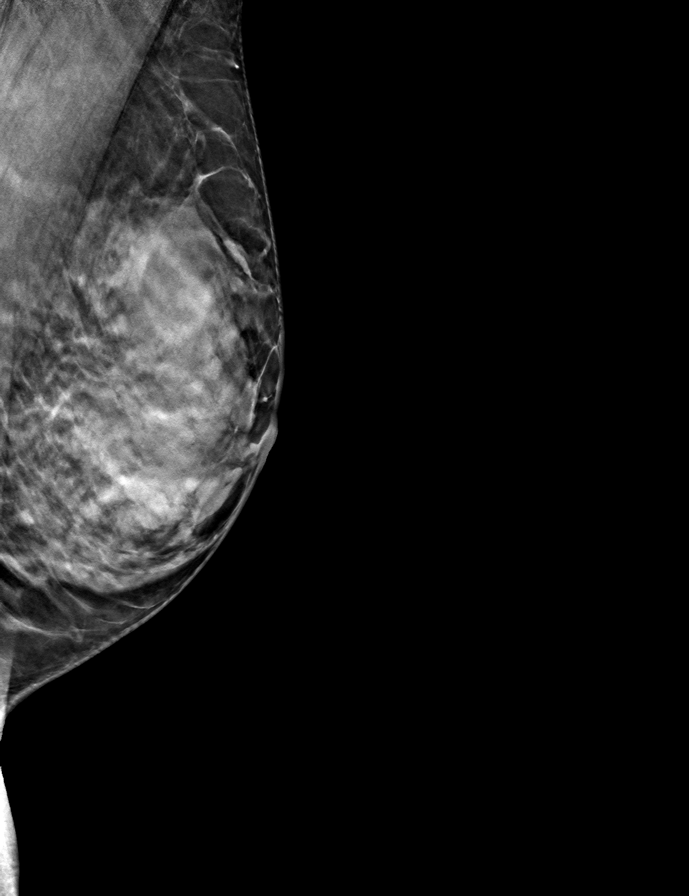

[8 of 24 positions shown; findings below may reference images not displayed]

ACR Breast Density Category d: The breast tissue is extremely dense,
which lowers the sensitivity of mammography.
FINDINGS: Spot compression tomosynthesis imaging through the lateral posterior
left breast demonstrates normal fibroglandular tissue. The oval mass
seen on the screening mammogram is not well visualized. Spot
compression tomosynthesis images through the asymmetry seen in the
cc projection in the lateral left breast on the screening mammogram
demonstrates no definite persistent abnormalities. The patient has
retropectoral implants.

Ultrasound targeted to the left breast at 3 o'clock, 4 cm from the
nipple demonstrates an anechoic oval circumscribed mass measuring 8
x 4 x 8 mm, consistent with a benign cyst.

Ultrasound of the left breast at 4 o'clock, 1 cm from the nipple
demonstrates a hypoechoic oval mass with indistinct margins
measuring 7 x 3 x 6 mm.

Multiple normal-appearing lymph nodes are found in the left axilla.
IMPRESSION: 1. There is an indeterminate 7 mm mass in the left breast at 4
o'clock.

2.  There is a 8 mm benign cyst in the left breast at 3 o'clock.

3.  No evidence of left axillary lymphadenopathy.

RECOMMENDATION:
Ultrasound guided biopsy is recommended for the left breast mass at
4 o'clock. The procedure has been scheduled for 08/21/2021 at [DATE]
p.m.

I have discussed the findings and recommendations with the patient.
If applicable, a reminder letter will be sent to the patient
regarding the next appointment.

BI-RADS CATEGORY  4: Suspicious.

## 2022-02-26 IMAGING — US US BREAST*L* LIMITED INC AXILLA
1 series · 13 of 14 positions shown · non-contrast
Comparison: Previous exam(s).

CLINICAL DATA: Screening recall for a possible left breast mass.

EXAM:
DIGITAL DIAGNOSTIC UNILATERAL LEFT MAMMOGRAM WITH IMPLANTS, CAD AND
TOMOSYNTHESIS; ULTRASOUND LEFT BREAST LIMITED
TECHNIQUE: Left digital diagnostic mammography and breast tomosynthesis was
performed. The images were evaluated with computer-aided detection.
Standard and/or implant displaced views were performed.; Targeted
ultrasound examination of the left breast was performed.

[Series 1: us breast*left* limited inc axilla · 0.06mm/px · 13 of 14 slices shown]
[im 1/14]
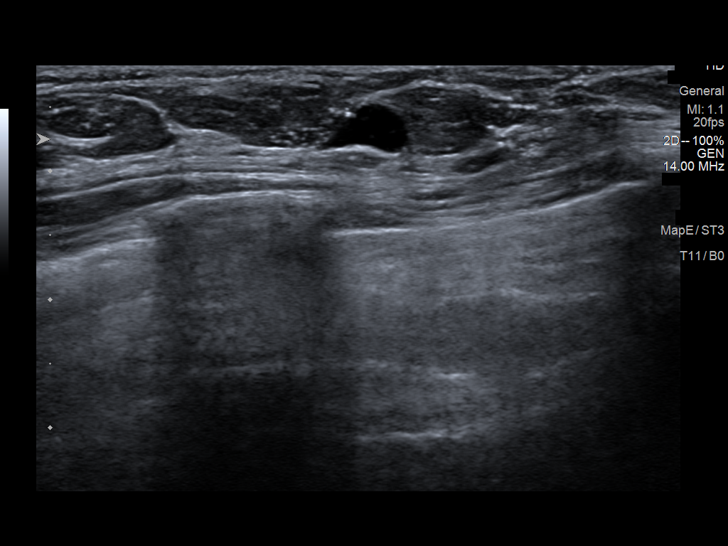
[im 2/14]
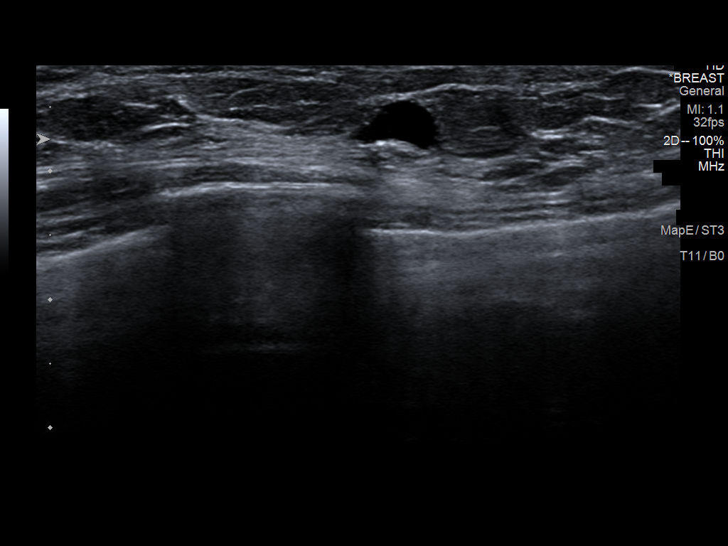
[im 3/14]
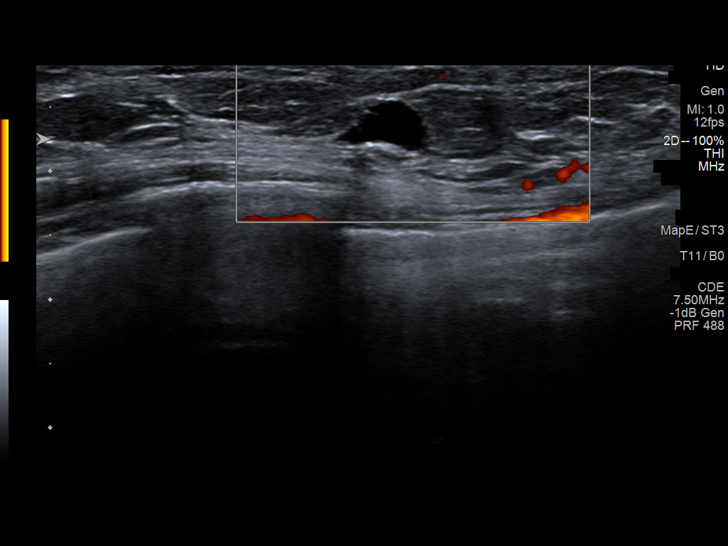
[im 4/14]
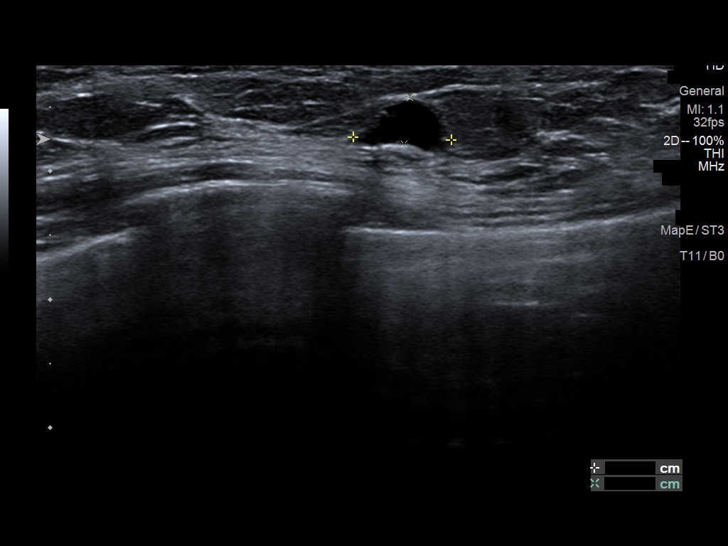
[im 5/14]
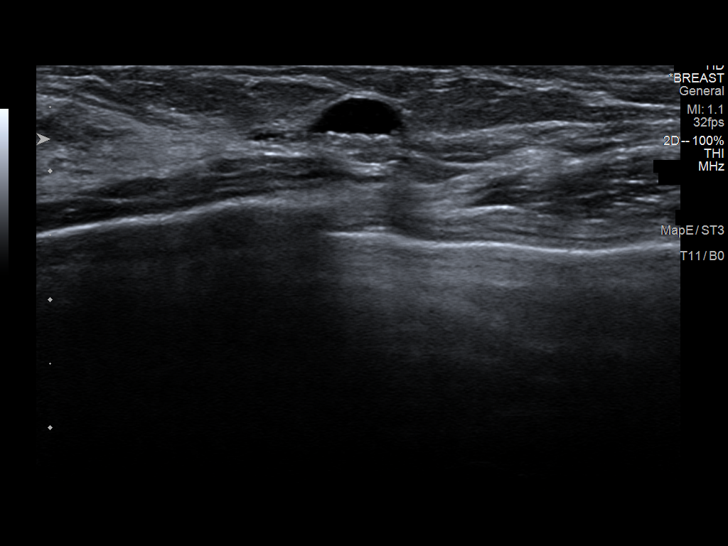
[im 6/14]
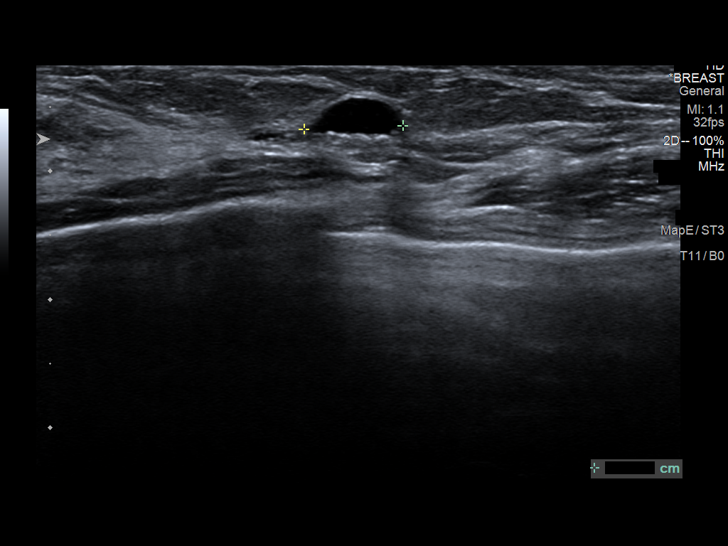
[im 8/14]
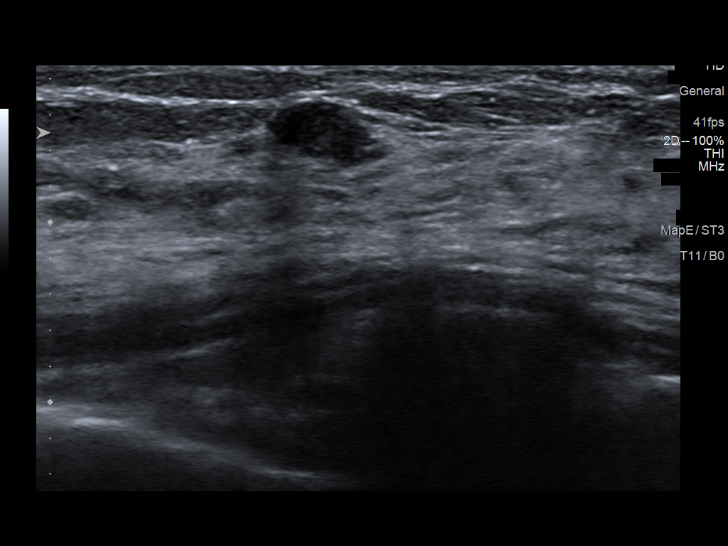
[im 9/14]
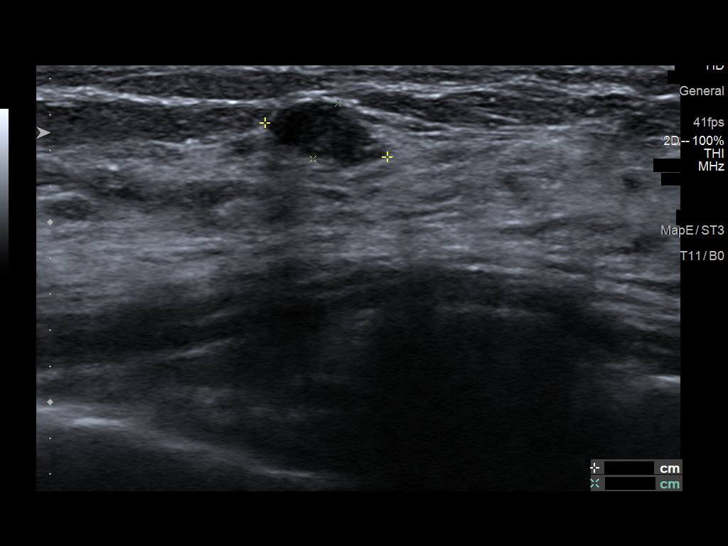
[im 10/14]
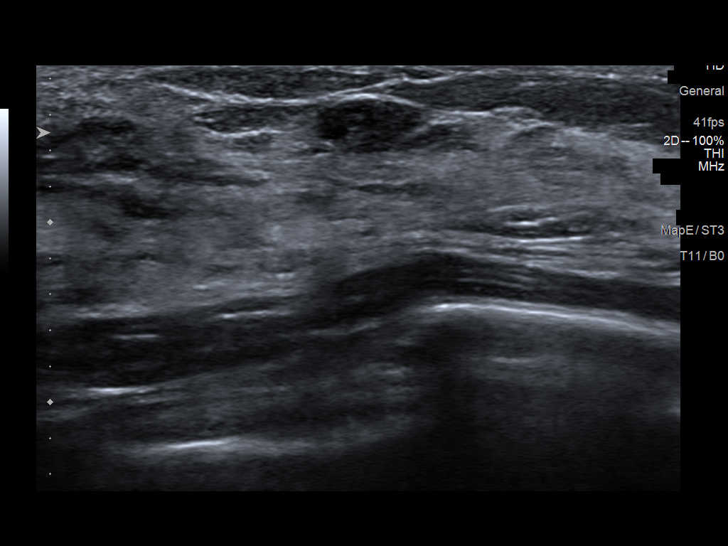
[im 11/14]
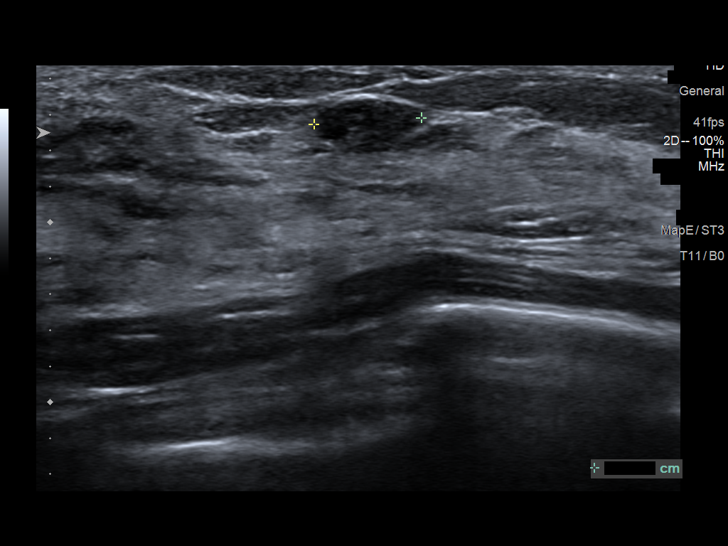
[im 12/14]
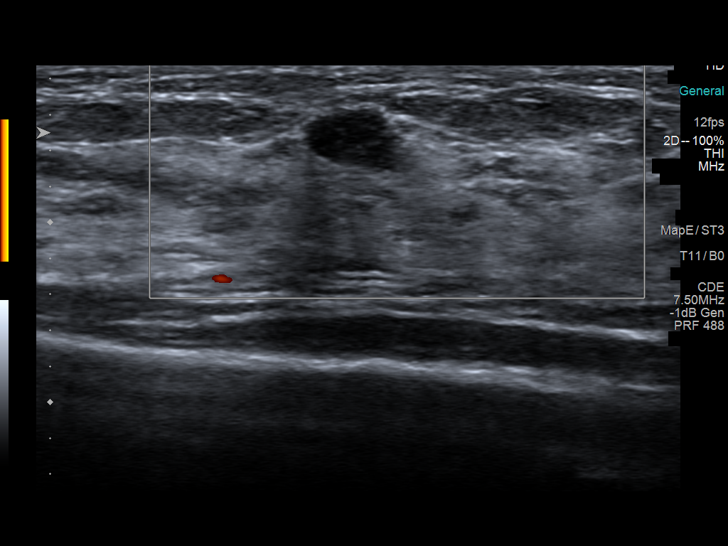
[im 13/14]
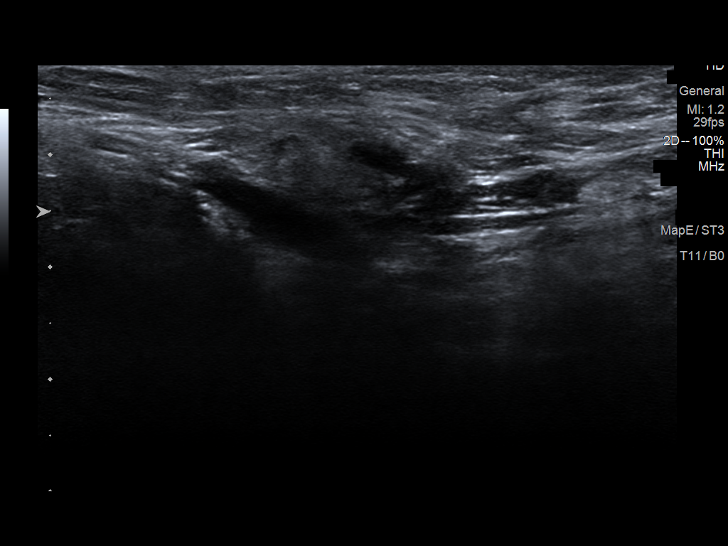
[im 14/14]
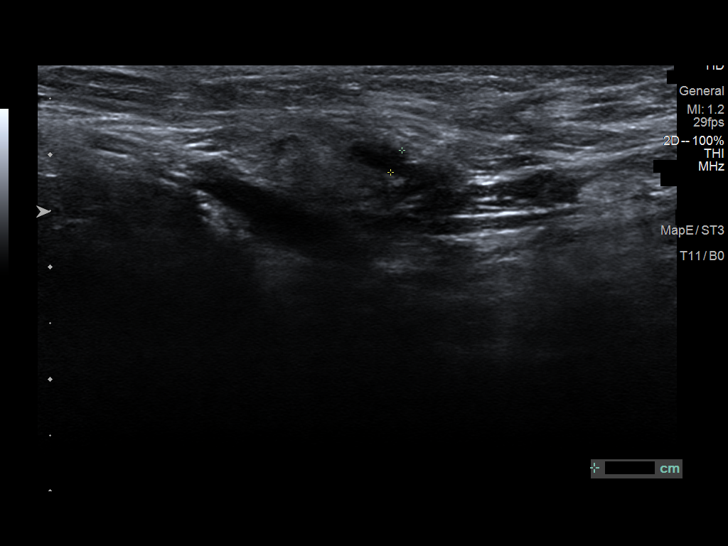

[13 of 14 positions shown; findings below may reference images not displayed]

ACR Breast Density Category d: The breast tissue is extremely dense,
which lowers the sensitivity of mammography.
FINDINGS: Spot compression tomosynthesis imaging through the lateral posterior
left breast demonstrates normal fibroglandular tissue. The oval mass
seen on the screening mammogram is not well visualized. Spot
compression tomosynthesis images through the asymmetry seen in the
cc projection in the lateral left breast on the screening mammogram
demonstrates no definite persistent abnormalities. The patient has
retropectoral implants.

Ultrasound targeted to the left breast at 3 o'clock, 4 cm from the
nipple demonstrates an anechoic oval circumscribed mass measuring 8
x 4 x 8 mm, consistent with a benign cyst.

Ultrasound of the left breast at 4 o'clock, 1 cm from the nipple
demonstrates a hypoechoic oval mass with indistinct margins
measuring 7 x 3 x 6 mm.

Multiple normal-appearing lymph nodes are found in the left axilla.
IMPRESSION: 1. There is an indeterminate 7 mm mass in the left breast at 4
o'clock.

2.  There is a 8 mm benign cyst in the left breast at 3 o'clock.

3.  No evidence of left axillary lymphadenopathy.

RECOMMENDATION:
Ultrasound guided biopsy is recommended for the left breast mass at
4 o'clock. The procedure has been scheduled for 08/21/2021 at [DATE]
p.m.

I have discussed the findings and recommendations with the patient.
If applicable, a reminder letter will be sent to the patient
regarding the next appointment.

BI-RADS CATEGORY  4: Suspicious.

## 2022-03-23 DIAGNOSIS — Z6820 Body mass index (BMI) 20.0-20.9, adult: Secondary | ICD-10-CM | POA: Diagnosis not present

## 2022-03-23 DIAGNOSIS — Z01419 Encounter for gynecological examination (general) (routine) without abnormal findings: Secondary | ICD-10-CM | POA: Diagnosis not present

## 2022-03-23 DIAGNOSIS — Z124 Encounter for screening for malignant neoplasm of cervix: Secondary | ICD-10-CM | POA: Diagnosis not present

## 2022-05-04 DIAGNOSIS — R350 Frequency of micturition: Secondary | ICD-10-CM | POA: Diagnosis not present

## 2022-05-04 DIAGNOSIS — R8279 Other abnormal findings on microbiological examination of urine: Secondary | ICD-10-CM | POA: Diagnosis not present

## 2022-05-04 DIAGNOSIS — R319 Hematuria, unspecified: Secondary | ICD-10-CM | POA: Diagnosis not present

## 2022-06-19 ENCOUNTER — Ambulatory Visit: Payer: BC Managed Care – PPO | Admitting: Sports Medicine

## 2022-06-19 DIAGNOSIS — M79651 Pain in right thigh: Secondary | ICD-10-CM

## 2022-06-19 DIAGNOSIS — D126 Benign neoplasm of colon, unspecified: Secondary | ICD-10-CM | POA: Diagnosis not present

## 2022-06-19 DIAGNOSIS — E7800 Pure hypercholesterolemia, unspecified: Secondary | ICD-10-CM | POA: Diagnosis not present

## 2022-06-19 DIAGNOSIS — Z0000 Encounter for general adult medical examination without abnormal findings: Secondary | ICD-10-CM | POA: Diagnosis not present

## 2022-06-19 DIAGNOSIS — S76311D Strain of muscle, fascia and tendon of the posterior muscle group at thigh level, right thigh, subsequent encounter: Secondary | ICD-10-CM | POA: Diagnosis not present

## 2022-06-19 DIAGNOSIS — S76311S Strain of muscle, fascia and tendon of the posterior muscle group at thigh level, right thigh, sequela: Secondary | ICD-10-CM | POA: Diagnosis not present

## 2022-06-19 DIAGNOSIS — I719 Aortic aneurysm of unspecified site, without rupture: Secondary | ICD-10-CM | POA: Diagnosis not present

## 2022-06-19 DIAGNOSIS — E559 Vitamin D deficiency, unspecified: Secondary | ICD-10-CM | POA: Diagnosis not present

## 2022-06-19 DIAGNOSIS — K589 Irritable bowel syndrome without diarrhea: Secondary | ICD-10-CM | POA: Diagnosis not present

## 2022-06-19 NOTE — Assessment & Plan Note (Signed)
[place] exercise protocol With 6 weeks of this we should hopefully see less of the HS sxs and maybe less of the neural irritation that likely affects left leg as well Add flexion exercises for low back  Reck in 6 weeks if not resolving

## 2022-06-19 NOTE — Progress Notes (Signed)
   Established Patient Office Visit  Subjective   Patient ID: Calli Bashor, female    DOB: 1961-10-22  Age: 61 y.o. 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  Her pain is described as a deep ache in the posterior aspect of her leg.  She notices discomfort with running only.  She mainly runs on the treadmill at [facility], pain is present before increasing incline within first 30-40 seconds of running, consistently.  No pain with walking or biking.  She did try 1 week of 800 mg ibuprofen 3 times daily with no change in her symptoms. She is also here today with complaint of some left lower leg pain, described as a burning sensation that is been off and on, however becoming more frequent.  She states the burning begins below the knee stays on the lateral aspect of her lower leg, very rarely does it crossed deeper than her ankle.  Pain is reproduced with slouching while sitting.  She denies any recent injuries or exacerbating symptoms. Of note she did have L2 burst fracture with fusion from L2-L5 and femur repair and then hardware removal. She has had some radicular sxs in left leg since that time.    Objective:     BP 103/71   Pulse (!) 52   Ht '5\' 8"'$  (1.727 m)   Wt 133 lb (60.3 kg)   LMP 12/15/2012   BMI 20.22 kg/m   Physical Exam Vitals reviewed.  Constitutional:      General: She is not in acute distress.    Appearance: Normal appearance. She is normal weight. She is not ill-appearing, toxic-appearing or diaphoretic.  HENT:     Head: Normocephalic.  Pulmonary:     Effort: Pulmonary effort is normal.  Neurological:     Mental Status: She is alert.  Right hip: No obvious deformity or asymmetry.  No ecchymosis or swelling.  Tenderness to palpation along the posterior aspect of the thigh near the intersection of biceps femoris and semimembranosus.  Full  range of motion hip flexion.  Strength 5/5 hip flexion, abduction, abduction.  Pain and weakness with resisted knee flexion with foot in internal and external rotation. This triggers cramping. Left lower leg: No obvious deformity or asymmetry.  No ecchymosis or swelling.  Well-healed surgical scars the lateral aspect distal femur, knee.  No tenderness to palpation along the tibialis anterior.  Negative for reproduction of symptoms tapping along fibular head.  Full range of motion at the ankles bilaterally, flexion, extension, eversion and inversion.  Strength 5/5 bilateral ankles plantarflexion and dorsiflexion, eversion and inversion.  Patellar reflex and Achilles reflex 2/4 bilateral  Running gait appeared neurtral with no major biomechanical flaws noted.    Assessment & Plan:    See problem list HS strain    Elmore Guise, DO I observed and examined the patient with the [facility] resident and agree with assessment and plan.  Note reviewed and modified by me. Ila Mcgill, MD

## 2022-08-07 ENCOUNTER — Ambulatory Visit: Payer: BC Managed Care – PPO | Admitting: Sports Medicine

## 2022-08-14 ENCOUNTER — Other Ambulatory Visit: Payer: Self-pay | Admitting: Obstetrics and Gynecology

## 2022-08-14 DIAGNOSIS — Z1231 Encounter for screening mammogram for malignant neoplasm of breast: Secondary | ICD-10-CM

## 2022-08-28 ENCOUNTER — Ambulatory Visit: Payer: BC Managed Care – PPO | Admitting: Sports Medicine

## 2022-09-11 ENCOUNTER — Ambulatory Visit: Payer: BC Managed Care – PPO | Attending: Cardiology

## 2022-09-11 DIAGNOSIS — I7121 Aneurysm of the ascending aorta, without rupture: Secondary | ICD-10-CM | POA: Diagnosis not present

## 2022-09-11 DIAGNOSIS — Z01812 Encounter for preprocedural laboratory examination: Secondary | ICD-10-CM | POA: Diagnosis not present

## 2022-09-12 LAB — BASIC METABOLIC PANEL
BUN/Creatinine Ratio: 13 (ref 12–28)
BUN: 9 mg/dL (ref 8–27)
CO2: 26 mmol/L (ref 20–29)
Calcium: 9.2 mg/dL (ref 8.7–10.3)
Chloride: 105 mmol/L (ref 96–106)
Creatinine, Ser: 0.68 mg/dL (ref 0.57–1.00)
Glucose: 88 mg/dL (ref 70–99)
Potassium: 4.3 mmol/L (ref 3.5–5.2)
Sodium: 141 mmol/L (ref 134–144)
eGFR: 99 mL/min/{1.73_m2} (ref >59)

## 2022-09-14 ENCOUNTER — Ambulatory Visit (HOSPITAL_COMMUNITY)
Admission: RE | Admit: 2022-09-14 | Discharge: 2022-09-14 | Disposition: A | Discharge status: Home / Self Care | Payer: BC Managed Care – PPO | Source: Ambulatory Visit | Attending: Cardiology | Admitting: Cardiology

## 2022-09-14 ENCOUNTER — Telehealth: Payer: Self-pay | Admitting: Cardiology

## 2022-09-14 ENCOUNTER — Ambulatory Visit
Admission: RE | Admit: 2022-09-14 | Discharge: 2022-09-14 | Disposition: A | Discharge status: Home / Self Care | Payer: BC Managed Care – PPO | Source: Ambulatory Visit | Attending: Obstetrics and Gynecology | Admitting: Obstetrics and Gynecology

## 2022-09-14 DIAGNOSIS — Z1231 Encounter for screening mammogram for malignant neoplasm of breast: Secondary | ICD-10-CM

## 2022-09-14 DIAGNOSIS — I7121 Aneurysm of the ascending aorta, without rupture: Secondary | ICD-10-CM | POA: Insufficient documentation

## 2022-09-14 DIAGNOSIS — Z01812 Encounter for preprocedural laboratory examination: Secondary | ICD-10-CM | POA: Insufficient documentation

## 2022-09-14 MED ORDER — IOHEXOL 350 MG/ML SOLN
50.0000 mL | Freq: Once | INTRAVENOUS | Status: AC | PRN
  Administered 2022-09-14: 50 mL via INTRAVENOUS

## 2022-09-14 NOTE — Telephone Encounter (Signed)
Patient would like to discuss results of CT Angio test.

## 2022-09-17 NOTE — Telephone Encounter (Signed)
Correction - testing was ct chest/aorta - results pending

## 2022-09-17 NOTE — Telephone Encounter (Signed)
Results of Coronary CT not available at this time.  Pt will be contacted with results when they are available.

## 2022-09-17 NOTE — Telephone Encounter (Signed)
Personally reviewed and measured in coaxial planes. Ascending aorta measures 42 mm, no significant change from prior calcium score CT.   We will continue to monitor.   Candee Furbish, MD   Left message for pt results and Dr Marlou Porch' comments/recommendations as on MyChart for her review.  Advised to call back if any further questions/concerns.

## 2022-09-18 ENCOUNTER — Inpatient Hospital Stay: Admission: RE | Admit: 2022-09-18 | Payer: BLUE CROSS/BLUE SHIELD | Source: Ambulatory Visit

## 2022-09-19 ENCOUNTER — Other Ambulatory Visit: Payer: BLUE CROSS/BLUE SHIELD

## 2022-10-01 ENCOUNTER — Ambulatory Visit: Payer: BC Managed Care – PPO | Admitting: Podiatry

## 2022-10-01 ENCOUNTER — Encounter: Payer: Self-pay | Admitting: Podiatry

## 2022-10-01 ENCOUNTER — Ambulatory Visit (INDEPENDENT_AMBULATORY_CARE_PROVIDER_SITE_OTHER): Payer: BC Managed Care – PPO

## 2022-10-01 DIAGNOSIS — M7671 Peroneal tendinitis, right leg: Secondary | ICD-10-CM | POA: Diagnosis not present

## 2022-10-01 DIAGNOSIS — M79671 Pain in right foot: Secondary | ICD-10-CM

## 2022-10-02 NOTE — Progress Notes (Signed)
Subjective:   Patient ID: Kathy Weaver, female   DOB: 61 y.o.   MRN: [id number]   HPI Patient presents concerned about the second digit of her right foot stating that she is very active and that the joint seems like there is fluid in it and she gets occasional cramps.  Has not had tingling or numbness associated with this   ROS      Objective:  Physical Exam  Neurovascular status intact muscle strength adequate range of motion adequate with mild inflammation around the second MPJ right with low-grade pain with palpation and patient who is very active and does wear proper shoe gear     Assessment:  Probability for low-grade inflammatory capsulitis second MPJ right foot     Plan:  H&P x-ray reviewed and at this point applied thick metatarsal pad to offload weight off the joint and she will continue with the orthotics that she has had made in the past.  Will wear rigid bottom shoes to the best of her ability and if symptoms persist may require drainage of the joint or other treatment  X-rays were negative for signs of fracture or any kinds of arthritic issue associated with her discomfort

## 2023-01-18 ENCOUNTER — Encounter: Payer: Self-pay | Admitting: Neurology

## 2023-01-29 DIAGNOSIS — R251 Tremor, unspecified: Secondary | ICD-10-CM | POA: Diagnosis not present

## 2023-01-29 DIAGNOSIS — E7800 Pure hypercholesterolemia, unspecified: Secondary | ICD-10-CM | POA: Diagnosis not present

## 2023-01-29 DIAGNOSIS — R5383 Other fatigue: Secondary | ICD-10-CM | POA: Diagnosis not present

## 2023-02-12 DIAGNOSIS — D72819 Decreased white blood cell count, unspecified: Secondary | ICD-10-CM | POA: Diagnosis not present

## 2023-02-26 DIAGNOSIS — C44519 Basal cell carcinoma of skin of other part of trunk: Secondary | ICD-10-CM | POA: Diagnosis not present

## 2023-02-26 DIAGNOSIS — C44319 Basal cell carcinoma of skin of other parts of face: Secondary | ICD-10-CM | POA: Diagnosis not present

## 2023-02-26 DIAGNOSIS — Z85828 Personal history of other malignant neoplasm of skin: Secondary | ICD-10-CM | POA: Diagnosis not present

## 2023-02-26 DIAGNOSIS — D2261 Melanocytic nevi of right upper limb, including shoulder: Secondary | ICD-10-CM | POA: Diagnosis not present

## 2023-02-26 DIAGNOSIS — D225 Melanocytic nevi of trunk: Secondary | ICD-10-CM | POA: Diagnosis not present

## 2023-02-26 DIAGNOSIS — D2262 Melanocytic nevi of left upper limb, including shoulder: Secondary | ICD-10-CM | POA: Diagnosis not present

## 2023-03-02 NOTE — Progress Notes (Unsigned)
Assessment/Plan:   Cervical Dystonia, very mild  -I talked to the patient about the nature and pathophysiology.  Printed info given to the patient.  She is not having trouble with ADL's and with rotation of the head in daily life for driving.  The primary muscles involved are the left sternocleidomastoid, right splenius capitis, right levator scapula.  We talked about treatments.  We talked about the value of botox.  The patient was educated on the botulinum toxin the black blox warning and given a copy of the botox patient medication guide.  The patient understands that this warning states that there have been reported cases of the Botox extending beyond the injection site and creating adverse effects, similar to those of botulism. This included loss of strength, trouble walking, hoarseness, trouble saying words clearly, loss of bladder control, trouble breathing, trouble swallowing, diplopia, blurry vision and ptosis. Most of the distant spread of Botox was happening in patients, primarily children, who received medication for spasticity or for cervical dystonia. The patient is not interested in that right now.  Her symptoms just do not warrant the treatment.  She can certainly let us know in the future if things get worse.  Subjective:   Kathy Weaver was seen today in the movement disorders clinic for neurologic consultation at the request of Maeola Harman, MD.  The consultation is for the evaluation of tremor.  Tremor is in the head and started 4-5 months ago.    She doesn't really have tremor in the hands.  Father had ET.    She states that its a "little twitch" and it is seems to happen when looking down a little.   No issues with rotation of the neck.    Other Specific Symptoms:  Voice: no change Sleep: sleep is good Postural symptoms:  Yes.   - does orange theory/yoga and bikes Loss of smell:  No. Loss of taste:  No. Urinary Incontinence:  No. Difficulty Swallowing:  No. Handwriting,  micrographia: No.   Neuroimaging of the brain has not previously been performed.    ALLERGIES:   Allergies  Allergen Reactions   Elemental Sulfur Swelling   Penicillins Hives   Sulfur Swelling    CURRENT MEDICATIONS:  Current Outpatient Medications  Medication Instructions   cholecalciferol (VITAMIN D) 1,000 Units, Daily   estradiol (VIVELLE-DOT) 0.0375 MG/24HR APPLY ONE PATCH TO THE SKIN TWICE A WEEK   progesterone (PROMETRIUM) 100 mg, Oral, Daily    Objective:   PHYSICAL EXAMINATION:    VITALS:   Vitals:   03/04/23 0905  BP: 122/82  Pulse: 71  SpO2: 97%  Weight: 130 lb 6.4 oz (59.1 kg)  Height: 5\' 8"  (1.727 m)    GEN:  The patient appears stated age and is in NAD. HEENT:  Normocephalic, atraumatic.  The mucous membranes are moist. The superficial temporal arteries are without ropiness or tenderness. CV:  RRR Lungs:  CTAB Neck/HEME:  There are no carotid bruits bilaterally.  Head slightly to the R and SB right.  I only saw the "pull" of the head one time during our visit.  Otherwise, head/neck was quiet without tremor  Neurological examination:  Orientation: The patient is alert and oriented x3.  Cranial nerves: There is good facial symmetry.  Extraocular muscles are intact. The visual fields are full to confrontational testing. The speech is fluent and clear. Soft palate rises symmetrically and there is no tongue deviation. Hearing is intact to conversational tone. Sensation: Sensation is intact to  light touch throughout. There is no extinction with double simultaneous stimulation.  Motor: Strength is 5/5 in the bilateral upper and lower extremities.   Shoulder shrug is equal and symmetric.  There is no pronator drift. Deep tendon reflexes: Deep tendon reflexes are 2/4 at the bilateral biceps, triceps, brachioradialis, patella and achilles. Plantar responses are downgoing bilaterally.  Movement examination: Tone: There is normal tone in the bilateral upper  extremities.  The tone in the lower extremities is nl.  Abnormal movements: As above.  There is no hand tremor noted, at rest, postural or intention Coordination:  There is no decremation with RAM's, with any form of RAMS, including alternating supination and pronation of the forearm, hand opening and closing, finger taps, heel taps and toe taps.  Gait and Station: The patient easily arises out of her chair.  She ambulates very well in the hall with good armswing I have reviewed and interpreted the following labs independently   Chemistry      Component Value Date/Time   NA 141 09/11/2022 0845   K 4.3 09/11/2022 0845   CL 105 09/11/2022 0845   CO2 26 09/11/2022 0845   BUN 9 09/11/2022 0845   CREATININE 0.68 09/11/2022 0845      Component Value Date/Time   CALCIUM 9.2 09/11/2022 0845      No results found for: "TSH" Lab Results  Component Value Date   HGB 16.1 (H) 12/30/2012     Cc:  Kirby Funk, MD

## 2023-03-04 ENCOUNTER — Encounter: Payer: Self-pay | Admitting: Neurology

## 2023-03-04 ENCOUNTER — Ambulatory Visit: Payer: BC Managed Care – PPO | Admitting: Neurology

## 2023-03-04 VITALS — BP 122/82 | HR 71 | Ht 68.0 in | Wt 130.4 lb

## 2023-03-04 DIAGNOSIS — G243 Spasmodic torticollis: Secondary | ICD-10-CM

## 2023-03-04 NOTE — Patient Instructions (Addendum)
The term cervical dystonia is used consistently nowadays and has ousted the term spasmodic torticollis for several reasons: few patients present with simple turning of the head; the abnormal movement is not always spasmodic; and, most importantly, nondystonic neck postures are called torticollis as well. 13 , 15 Dystonia recently has been redefined as a movement disorder characterized by sustained or intermittent muscle contractions, causing abnormal and often repetitive movements, postures, or both. 16 CD affects predominantly the neck, including the anterior and posterior neck muscles. 17 In some patients, the shoulder is involved with protraction or elevation of the shoulder. In a subset of patients, CD is a feature of more widespread segmental dystonia also involving muscles of the face, larynx, or upper extremities. Frequently, there are also tremulous or jerking movements of the head. Dystonic tremor is more irregular than essential tremor and is most evident when the patient attempts to move the head in the direction contralateral to the force of the dystonia. Most frequently, CD is accompanied by neck pain that may be severe and lead to further incapacitation. In many instances, patients employ a "sensory trick" such as touching the chin, holding the neck, or other maneuvers to decrease the dystonic activity, also called geste antagoniste . CD is the most frequent form of focal dystonia. 15 , 17 Its prevalence is estimated to range between 5 and 13 per 10,000 people in [place] countries. The mean age of onset is 41 years, and, similar to other forms of focal dystonia, there is a slight female preponderance (about 1.2:1). In the vast majority of patients with CD, no underlying cause can be identified, while various genetic defects have been identified in a few instances. Until recently, both idiopathic and genetic manifestations would have been referred to as primary dystonia, but the new classification uses  the terms idiopathic dystonia and inherited dystonia, respectively. 16 The new nomenclature has been widely accepted in the past few years, but compliance with it has been comparatively lower in "surgical" studies. 18 The term acquired has replaced the description of secondary dystonia in the rare case of CD caused by trauma or exposure to neuroleptic drugs. Various basic patterns of CD have been defined according to head position. 15 , 17 Torticollis indicates rotation of the head about the head-body-axis (movement of the chin toward one shoulder in the horizontal plane); laterocollis is defined as a sideward tilt of the head (movement of the ear toward the ipsilateral shoulder); anterocollis is defined as flexion of the neck in the anterior-posterior-axis; and retrocollis is defined as extension in the anterior-posterior axis. Lateral shift depicts translation of the axis of the head in the horizontal plane, and sagittal shift in the sagittal plain. Commonly, a combination of these abnormalities is present, depending on the degree of involvement of different cervical muscles ( Fig. 118.1 ). It is important to understand which muscles are causing the dystonic posture or movements in an individual patient. This issue is even more important when deciding which muscles should be denervated because, in a given patient, dystonic activity may vary in different muscles yet produce the same dystonic posture. 19  The first-line treatment of CD is chemodenervation with BoNT type A. 15 , 17 , 23 The dose and the sites of injection must be individualized to obtain optimal results. The efficacy and safety of BoNT has been demonstrated in several randomized controlled and open trials. Most report improvement in about 90% of CD patients, with mild side effects in up to 28%. The effect  of BoNT is usually noted about 1 week after injection, and the average duration of the benefit is 3 to 4 months. In patients who are resistant  to BoNT type A, BoNT type B and other newer preparations have become an alternative. It must be noted, however, that the average duration of maximum improvement is much shorter, side effects are more frequent, and immunogenity is higher with BoNT B. Pharmacotherapy for CD includes mainly anticholinergic drugs or muscle relaxants. Overall, drug treatment plays a minor role in CD, although many patients also take analgesics for alleviation of chronic neck pain.

## 2023-03-19 ENCOUNTER — Ambulatory Visit: Payer: BC Managed Care – PPO | Admitting: Neurology

## 2023-03-26 DIAGNOSIS — Z01419 Encounter for gynecological examination (general) (routine) without abnormal findings: Secondary | ICD-10-CM | POA: Diagnosis not present

## 2023-03-26 DIAGNOSIS — Z13820 Encounter for screening for osteoporosis: Secondary | ICD-10-CM | POA: Diagnosis not present

## 2023-03-26 DIAGNOSIS — Z681 Body mass index (BMI) 19 or less, adult: Secondary | ICD-10-CM | POA: Diagnosis not present

## 2023-03-26 DIAGNOSIS — Z124 Encounter for screening for malignant neoplasm of cervix: Secondary | ICD-10-CM | POA: Diagnosis not present

## 2023-05-16 DIAGNOSIS — L905 Scar conditions and fibrosis of skin: Secondary | ICD-10-CM | POA: Diagnosis not present

## 2023-05-16 DIAGNOSIS — C44319 Basal cell carcinoma of skin of other parts of face: Secondary | ICD-10-CM | POA: Diagnosis not present

## 2023-05-16 DIAGNOSIS — C44519 Basal cell carcinoma of skin of other part of trunk: Secondary | ICD-10-CM | POA: Diagnosis not present

## 2023-08-27 ENCOUNTER — Telehealth: Payer: Self-pay | Admitting: Cardiology

## 2023-08-27 DIAGNOSIS — Z01812 Encounter for preprocedural laboratory examination: Secondary | ICD-10-CM

## 2023-08-27 DIAGNOSIS — I7121 Aneurysm of the ascending aorta, without rupture: Secondary | ICD-10-CM

## 2023-08-27 NOTE — Telephone Encounter (Signed)
Pt's last CTA chest/aorta 09/14/22:  Personally reviewed and measured in coaxial planes. Ascending aorta measures 42 mm, no significant change from prior calcium score CT. We will continue to monitor. Donato Schultz, MD  last echo 11/22: Normal pump function Aortic valve is tricuspid Measurement on echo was 39 mm aortic root and 37 mm ascending aorta (mild) In one year, we will check a CTA of chest to compare. Donato Schultz, MD  Last office visit 08/29/21.  Will send to Dr Anne Fu for review and any orders.

## 2023-08-27 NOTE — Telephone Encounter (Signed)
Patient calling to see if Dr what's to re image her aorta. Please advise

## 2023-08-27 NOTE — Telephone Encounter (Signed)
In late November 2024, let's have her set up with Chest CTA to measure aorta That will be a one year follow up of aortic measure Kathy Schultz, MD    Order placed for CTA chest/aorta and BMP prior. Pt is aware she will be contacted to be schedules.  Aware she will need BMP prior to.  Reports she is scheduled with her PCP early November and may have the blood work at that time.  She is aware she will be contacted to be scheduled.

## 2023-09-06 DIAGNOSIS — D72819 Decreased white blood cell count, unspecified: Secondary | ICD-10-CM | POA: Diagnosis not present

## 2023-09-06 DIAGNOSIS — E559 Vitamin D deficiency, unspecified: Secondary | ICD-10-CM | POA: Diagnosis not present

## 2023-09-06 DIAGNOSIS — Z0000 Encounter for general adult medical examination without abnormal findings: Secondary | ICD-10-CM | POA: Diagnosis not present

## 2023-09-06 DIAGNOSIS — Z23 Encounter for immunization: Secondary | ICD-10-CM | POA: Diagnosis not present

## 2023-09-06 DIAGNOSIS — I719 Aortic aneurysm of unspecified site, without rupture: Secondary | ICD-10-CM | POA: Diagnosis not present

## 2023-09-06 DIAGNOSIS — E7800 Pure hypercholesterolemia, unspecified: Secondary | ICD-10-CM | POA: Diagnosis not present

## 2023-09-06 LAB — LAB REPORT - SCANNED: EGFR: 102

## 2023-09-06 LAB — BMP8+EGFR: EGFR: 102

## 2023-09-09 NOTE — Telephone Encounter (Signed)
Pt called in to let you know that she had blood work done with PCP and will have them fax over results for CT

## 2023-09-27 ENCOUNTER — Other Ambulatory Visit (HOSPITAL_COMMUNITY): Payer: BC Managed Care – PPO

## 2023-10-01 ENCOUNTER — Ambulatory Visit (HOSPITAL_COMMUNITY)
Admission: RE | Admit: 2023-10-01 | Discharge: 2023-10-01 | Disposition: A | Discharge status: Home / Self Care | Payer: BC Managed Care – PPO | Source: Ambulatory Visit | Attending: Cardiology | Admitting: Cardiology

## 2023-10-01 ENCOUNTER — Other Ambulatory Visit: Payer: Self-pay | Admitting: Obstetrics and Gynecology

## 2023-10-01 DIAGNOSIS — Z01812 Encounter for preprocedural laboratory examination: Secondary | ICD-10-CM | POA: Diagnosis not present

## 2023-10-01 DIAGNOSIS — I517 Cardiomegaly: Secondary | ICD-10-CM | POA: Diagnosis not present

## 2023-10-01 DIAGNOSIS — I7121 Aneurysm of the ascending aorta, without rupture: Secondary | ICD-10-CM

## 2023-10-01 DIAGNOSIS — I719 Aortic aneurysm of unspecified site, without rupture: Secondary | ICD-10-CM | POA: Diagnosis not present

## 2023-10-01 DIAGNOSIS — Z1231 Encounter for screening mammogram for malignant neoplasm of breast: Secondary | ICD-10-CM

## 2023-10-01 MED ORDER — IOHEXOL 350 MG/ML SOLN
75.0000 mL | Freq: Once | INTRAVENOUS | Status: AC | PRN
  Administered 2023-10-01: 75 mL via INTRAVENOUS

## 2023-10-08 ENCOUNTER — Telehealth: Payer: Self-pay | Admitting: Cardiology

## 2023-10-08 DIAGNOSIS — I7121 Aneurysm of the ascending aorta, without rupture: Secondary | ICD-10-CM

## 2023-10-08 NOTE — Telephone Encounter (Signed)
Patient is calling to follow up on CT Results. Please advise.

## 2023-10-08 NOTE — Telephone Encounter (Signed)
Pt aware of order for echo and that she will be called to be scheduled.

## 2023-10-08 NOTE — Telephone Encounter (Signed)
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 No significant change since 2022   Continue with annual monitoring CTA of chest with contrast.  Reviewed the above results and comments with pt.  She was concerned about the result below: Heart:  Cardiomegaly. No pericardial fluid/thickening. No significant coronary artery calcifications.  Reviewed the above with Dr Anne Fu who advised to order echo to measure size of the heart.  This will be a more accurate measure.

## 2023-10-11 ENCOUNTER — Telehealth: Payer: Self-pay | Admitting: Cardiology

## 2023-10-11 NOTE — Telephone Encounter (Signed)
Patient is requesting call back from Dr. Anne Fu to discuss echo results. She states she would like to speak to Dr. Anne Fu directly. She states that he can also text if that would be easier for him.

## 2023-10-11 NOTE — Telephone Encounter (Signed)
Dr Anne Fu contacted pt and advised echo is not needed at this time.  She is aware and echo has been cancelled.

## 2023-10-11 NOTE — Telephone Encounter (Signed)
Sent her text message to tell her that she does not need echocardiogram.  I think the radiologist over called the cardiomegaly.  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  Recent echocardiogram showed normal cardiac dimensions.

## 2023-10-16 ENCOUNTER — Ambulatory Visit (HOSPITAL_COMMUNITY): Payer: BC Managed Care – PPO

## 2023-10-22 ENCOUNTER — Other Ambulatory Visit (HOSPITAL_COMMUNITY): Payer: BC Managed Care – PPO

## 2023-11-04 DIAGNOSIS — D485 Neoplasm of uncertain behavior of skin: Secondary | ICD-10-CM | POA: Diagnosis not present

## 2023-11-04 DIAGNOSIS — B078 Other viral warts: Secondary | ICD-10-CM | POA: Diagnosis not present

## 2023-11-05 ENCOUNTER — Ambulatory Visit: Payer: BC Managed Care – PPO

## 2023-11-22 ENCOUNTER — Ambulatory Visit: Payer: BC Managed Care – PPO

## 2023-12-03 ENCOUNTER — Ambulatory Visit
Admission: RE | Admit: 2023-12-03 | Discharge: 2023-12-03 | Disposition: A | Discharge status: Home / Self Care | Payer: BC Managed Care – PPO | Source: Ambulatory Visit | Attending: Obstetrics and Gynecology | Admitting: Obstetrics and Gynecology

## 2023-12-03 DIAGNOSIS — Z1231 Encounter for screening mammogram for malignant neoplasm of breast: Secondary | ICD-10-CM | POA: Diagnosis not present

## 2024-02-25 DIAGNOSIS — L821 Other seborrheic keratosis: Secondary | ICD-10-CM | POA: Diagnosis not present

## 2024-02-25 DIAGNOSIS — Z85828 Personal history of other malignant neoplasm of skin: Secondary | ICD-10-CM | POA: Diagnosis not present

## 2024-02-25 DIAGNOSIS — L905 Scar conditions and fibrosis of skin: Secondary | ICD-10-CM | POA: Diagnosis not present

## 2024-02-25 DIAGNOSIS — D485 Neoplasm of uncertain behavior of skin: Secondary | ICD-10-CM | POA: Diagnosis not present

## 2024-04-24 DIAGNOSIS — N750 Cyst of Bartholin's gland: Secondary | ICD-10-CM | POA: Diagnosis not present

## 2024-04-24 DIAGNOSIS — D709 Neutropenia, unspecified: Secondary | ICD-10-CM | POA: Diagnosis not present

## 2024-04-27 DIAGNOSIS — Z124 Encounter for screening for malignant neoplasm of cervix: Secondary | ICD-10-CM | POA: Diagnosis not present

## 2024-04-27 DIAGNOSIS — Z6820 Body mass index (BMI) 20.0-20.9, adult: Secondary | ICD-10-CM | POA: Diagnosis not present

## 2024-04-27 DIAGNOSIS — Z01419 Encounter for gynecological examination (general) (routine) without abnormal findings: Secondary | ICD-10-CM | POA: Diagnosis not present

## 2024-05-11 DIAGNOSIS — M542 Cervicalgia: Secondary | ICD-10-CM | POA: Diagnosis not present

## 2024-05-19 DIAGNOSIS — N758 Other diseases of Bartholin's gland: Secondary | ICD-10-CM | POA: Diagnosis not present

## 2024-06-12 DIAGNOSIS — R102 Pelvic and perineal pain: Secondary | ICD-10-CM | POA: Diagnosis not present

## 2024-06-12 DIAGNOSIS — N751 Abscess of Bartholin's gland: Secondary | ICD-10-CM | POA: Diagnosis not present

## 2024-09-07 DIAGNOSIS — Z23 Encounter for immunization: Secondary | ICD-10-CM | POA: Diagnosis not present

## 2024-09-07 DIAGNOSIS — Z0000 Encounter for general adult medical examination without abnormal findings: Secondary | ICD-10-CM | POA: Diagnosis not present

## 2024-09-07 DIAGNOSIS — D72819 Decreased white blood cell count, unspecified: Secondary | ICD-10-CM | POA: Diagnosis not present

## 2024-09-07 DIAGNOSIS — E7800 Pure hypercholesterolemia, unspecified: Secondary | ICD-10-CM | POA: Diagnosis not present

## 2024-09-07 DIAGNOSIS — I719 Aortic aneurysm of unspecified site, without rupture: Secondary | ICD-10-CM | POA: Diagnosis not present

## 2024-09-08 DIAGNOSIS — E559 Vitamin D deficiency, unspecified: Secondary | ICD-10-CM | POA: Diagnosis not present

## 2024-09-08 DIAGNOSIS — D72819 Decreased white blood cell count, unspecified: Secondary | ICD-10-CM | POA: Diagnosis not present

## 2024-09-08 DIAGNOSIS — I719 Aortic aneurysm of unspecified site, without rupture: Secondary | ICD-10-CM | POA: Diagnosis not present

## 2024-09-08 DIAGNOSIS — E7800 Pure hypercholesterolemia, unspecified: Secondary | ICD-10-CM | POA: Diagnosis not present

## 2024-09-08 DIAGNOSIS — Z0000 Encounter for general adult medical examination without abnormal findings: Secondary | ICD-10-CM | POA: Diagnosis not present

## 2024-09-25 ENCOUNTER — Telehealth: Payer: Self-pay | Admitting: Cardiology

## 2024-09-25 DIAGNOSIS — I7121 Aneurysm of the ascending aorta, without rupture: Secondary | ICD-10-CM

## 2024-09-25 NOTE — Telephone Encounter (Signed)
 Pt was ordered to have 1 year f/u of dilated ascending aorta by repeat CTA chest/aorta.  Will forward to Dr Jeffrie for his review due to her question of having test test completed without contrast.

## 2024-09-25 NOTE — Telephone Encounter (Signed)
 Patient requested to send a message to Dr. Jeffrie to see if her CT can be done without contrast. Please advise.

## 2024-09-28 NOTE — Telephone Encounter (Signed)
 Sure,  Let's order a coronary calcium score (no contrast involved) - to monitor aorta as well.  Oneil Parchment, MD  Pt is aware of the above order and that she will be contacted to be scheduled.  Aware testing will be completed here at [address] - [address] floor and the cost is $99 at the time of service.

## 2024-10-13 DIAGNOSIS — L570 Actinic keratosis: Secondary | ICD-10-CM | POA: Diagnosis not present

## 2024-10-20 ENCOUNTER — Ambulatory Visit (HOSPITAL_BASED_OUTPATIENT_CLINIC_OR_DEPARTMENT_OTHER)
Admission: RE | Admit: 2024-10-20 | Discharge: 2024-10-20 | Disposition: A | Payer: Self-pay | Source: Ambulatory Visit | Attending: Cardiology | Admitting: Cardiology

## 2024-10-20 DIAGNOSIS — I7121 Aneurysm of the ascending aorta, without rupture: Secondary | ICD-10-CM | POA: Insufficient documentation

## 2024-10-23 ENCOUNTER — Ambulatory Visit: Payer: Self-pay | Admitting: Cardiology

## 2024-10-30 ENCOUNTER — Telehealth: Payer: Self-pay | Admitting: Cardiology

## 2024-10-30 DIAGNOSIS — I7121 Aneurysm of the ascending aorta, without rupture: Secondary | ICD-10-CM

## 2024-10-30 NOTE — Telephone Encounter (Signed)
 The patient has been notified of coronary calcium score result and verbalized understanding.    Roxie LITTIE Louder, RN 10/30/2024 11:31 AM    Patient is concerned about the progressive increase in measurement of aorta since 2022, going from 42 mm to 43 mm to now 44 mm. She would like more clarification on this and how this is considered stable if it is increasing each year.  Informed patient Dr. Jeffrie is out of the office today, but I will forward message to him to review and address her questions.

## 2024-10-30 NOTE — Telephone Encounter (Signed)
 Patient called to follow-up on the aortic root Calcium Score test results.

## 2024-11-02 NOTE — Telephone Encounter (Signed)
 Spoke with Dr. Laurier and personally measured coaxial aortic dimensions as best seen on non contrasted coronary calcium score.    In 10/2025, let's order a MRI of the aorta for her. This will reduce her overall radiation burden.    Thanks   Oneil Parchment, MD   For clarification of order - will this be ordered as MRI chest w/wo contrast or Cardiac MRI? Thank you

## 2024-11-02 NOTE — Telephone Encounter (Signed)
 MR angio of chest w wo contrast (MR angiogram) Put in comments - measure ascending aorta   Thanks  MR angio of chest w wo contrast ordered to be completed 10/2025.  Pt will be contacted to be scheduled.
